# Patient Record
Sex: Female | Born: 1980 | Race: White | Hispanic: No | Marital: Married | Smoking: Never smoker
Health system: Southern US, Community
[De-identification: ages and names within clinical notes are randomized; demographics above are authoritative.]

## PROBLEM LIST (undated history)

## (undated) DIAGNOSIS — N200 Calculus of kidney: Secondary | ICD-10-CM

## (undated) DIAGNOSIS — Z87442 Personal history of urinary calculi: Secondary | ICD-10-CM

## (undated) DIAGNOSIS — I10 Essential (primary) hypertension: Secondary | ICD-10-CM

## (undated) DIAGNOSIS — N12 Tubulo-interstitial nephritis, not specified as acute or chronic: Secondary | ICD-10-CM

## (undated) DIAGNOSIS — G43909 Migraine, unspecified, not intractable, without status migrainosus: Secondary | ICD-10-CM

## (undated) DIAGNOSIS — J189 Pneumonia, unspecified organism: Secondary | ICD-10-CM

---

## 2002-11-28 HISTORY — PX: TONSILLECTOMY: SUR1361

## 2005-04-27 ENCOUNTER — Emergency Department: Payer: Self-pay | Admitting: Emergency Medicine

## 2006-06-19 ENCOUNTER — Emergency Department: Payer: Self-pay | Admitting: Emergency Medicine

## 2013-11-24 ENCOUNTER — Emergency Department: Payer: Self-pay | Admitting: Emergency Medicine

## 2013-11-24 LAB — URINALYSIS, COMPLETE
Bilirubin,UR: NEGATIVE
Glucose,UR: NEGATIVE mg/dL (ref 0–75)
Leukocyte Esterase: NEGATIVE
Ph: 8 (ref 4.5–8.0)
Protein: NEGATIVE
RBC,UR: 1 /HPF (ref 0–5)
WBC UR: 2 /HPF (ref 0–5)

## 2013-11-24 LAB — TROPONIN I: Troponin-I: <0.02 ng/mL

## 2013-11-24 LAB — CBC WITH DIFFERENTIAL/PLATELET
Basophil #: 0.1 10*3/uL (ref 0.0–0.1)
Basophil %: 1 %
Eosinophil #: 0.2 10*3/uL (ref 0.0–0.7)
HCT: 41.5 % (ref 35.0–47.0)
Lymphocyte #: 4.3 10*3/uL — ABNORMAL HIGH (ref 1.0–3.6)
Lymphocyte %: 28.6 %
MCH: 28.7 pg (ref 26.0–34.0)
MCHC: 32.9 g/dL (ref 32.0–36.0)
Monocyte #: 1.3 x10 3/mm — ABNORMAL HIGH (ref 0.2–0.9)
Neutrophil %: 60.3 %
Platelet: 291 10*3/uL (ref 150–440)
RBC: 4.76 10*6/uL (ref 3.80–5.20)
RDW: 14.2 % (ref 11.5–14.5)

## 2013-11-24 LAB — LIPASE, BLOOD: Lipase: 248 U/L (ref 73–393)

## 2013-11-24 LAB — COMPREHENSIVE METABOLIC PANEL
Alkaline Phosphatase: 85 U/L
Anion Gap: 5 — ABNORMAL LOW (ref 7–16)
Bilirubin,Total: 0.2 mg/dL (ref 0.2–1.0)
Calcium, Total: 9 mg/dL (ref 8.5–10.1)
Chloride: 109 mmol/L — ABNORMAL HIGH (ref 98–107)
Co2: 25 mmol/L (ref 21–32)
EGFR (Non-African Amer.): >60
Glucose: 138 mg/dL — ABNORMAL HIGH (ref 65–99)
Osmolality: 280 (ref 275–301)
Potassium: 4.2 mmol/L (ref 3.5–5.1)
SGOT(AST): 23 U/L (ref 15–37)
SGPT (ALT): 25 U/L (ref 12–78)
Sodium: 139 mmol/L (ref 136–145)

## 2013-11-24 LAB — PREGNANCY, URINE: Pregnancy Test, Urine: NEGATIVE m[IU]/mL

## 2014-03-31 DIAGNOSIS — I10 Essential (primary) hypertension: Secondary | ICD-10-CM | POA: Insufficient documentation

## 2014-03-31 DIAGNOSIS — G43909 Migraine, unspecified, not intractable, without status migrainosus: Secondary | ICD-10-CM | POA: Insufficient documentation

## 2015-05-19 DIAGNOSIS — G4700 Insomnia, unspecified: Secondary | ICD-10-CM | POA: Insufficient documentation

## 2015-05-19 DIAGNOSIS — Z0000 Encounter for general adult medical examination without abnormal findings: Secondary | ICD-10-CM | POA: Insufficient documentation

## 2015-05-19 DIAGNOSIS — K219 Gastro-esophageal reflux disease without esophagitis: Secondary | ICD-10-CM | POA: Insufficient documentation

## 2016-11-28 HISTORY — PX: CHOLECYSTECTOMY: SHX55

## 2017-05-26 ENCOUNTER — Encounter: Payer: Self-pay | Admitting: Emergency Medicine

## 2017-05-26 ENCOUNTER — Emergency Department: Payer: Self-pay

## 2017-05-26 ENCOUNTER — Emergency Department
Admission: EM | Admit: 2017-05-26 | Discharge: 2017-05-26 | Disposition: A | Discharge status: Home / Self Care | Payer: Self-pay | Attending: Emergency Medicine | Admitting: Emergency Medicine

## 2017-05-26 DIAGNOSIS — I10 Essential (primary) hypertension: Secondary | ICD-10-CM | POA: Insufficient documentation

## 2017-05-26 DIAGNOSIS — N200 Calculus of kidney: Secondary | ICD-10-CM | POA: Insufficient documentation

## 2017-05-26 HISTORY — DX: Essential (primary) hypertension: I10

## 2017-05-26 HISTORY — DX: Calculus of kidney: N20.0

## 2017-05-26 HISTORY — DX: Migraine, unspecified, not intractable, without status migrainosus: G43.909

## 2017-05-26 LAB — CBC
HCT: 40.8 % (ref 35.0–47.0)
HEMOGLOBIN: 13.7 g/dL (ref 12.0–16.0)
MCH: 29.2 pg (ref 26.0–34.0)
MCHC: 33.5 g/dL (ref 32.0–36.0)
MCV: 87.2 fL (ref 80.0–100.0)
Platelets: 322 10*3/uL (ref 150–440)
RBC: 4.68 MIL/uL (ref 3.80–5.20)
RDW: 15.8 % — ABNORMAL HIGH (ref 11.5–14.5)
WBC: 21 10*3/uL — ABNORMAL HIGH (ref 3.6–11.0)

## 2017-05-26 LAB — URINALYSIS, COMPLETE (UACMP) WITH MICROSCOPIC
Bilirubin Urine: NEGATIVE
Glucose, UA: NEGATIVE mg/dL
KETONES UR: NEGATIVE mg/dL
Nitrite: POSITIVE — AB
PH: 6 (ref 5.0–8.0)
Protein, ur: 100 mg/dL — AB
Specific Gravity, Urine: 1.024 (ref 1.005–1.030)

## 2017-05-26 LAB — POC URINE PREG, ED: PREG TEST UR: NEGATIVE

## 2017-05-26 LAB — BASIC METABOLIC PANEL
ANION GAP: 11 (ref 5–15)
BUN: 12 mg/dL (ref 6–20)
CALCIUM: 8.8 mg/dL — AB (ref 8.9–10.3)
CO2: 25 mmol/L (ref 22–32)
Chloride: 98 mmol/L — ABNORMAL LOW (ref 101–111)
Creatinine, Ser: 1 mg/dL (ref 0.44–1.00)
GFR calc non Af Amer: >60 mL/min (ref >60)
Glucose, Bld: 181 mg/dL — ABNORMAL HIGH (ref 65–99)
POTASSIUM: 2.8 mmol/L — AB (ref 3.5–5.1)
Sodium: 134 mmol/L — ABNORMAL LOW (ref 135–145)

## 2017-05-26 MED ORDER — MORPHINE SULFATE (PF) 4 MG/ML IV SOLN
4.0000 mg | Freq: Once | INTRAVENOUS | Status: AC
  Administered 2017-05-26: 4 mg via INTRAVENOUS

## 2017-05-26 MED ORDER — ONDANSETRON HCL 4 MG/2ML IJ SOLN
INTRAMUSCULAR | Status: AC
  Administered 2017-05-26: 4 mg via INTRAVENOUS
  Filled 2017-05-26: qty 2

## 2017-05-26 MED ORDER — ONDANSETRON HCL 4 MG/2ML IJ SOLN
4.0000 mg | Freq: Once | INTRAMUSCULAR | Status: AC
  Administered 2017-05-26: 4 mg via INTRAVENOUS

## 2017-05-26 MED ORDER — MORPHINE SULFATE (PF) 4 MG/ML IV SOLN
INTRAVENOUS | Status: AC
  Administered 2017-05-26: 4 mg via INTRAVENOUS
  Filled 2017-05-26: qty 1

## 2017-05-26 MED ORDER — KETOROLAC TROMETHAMINE 30 MG/ML IJ SOLN
30.0000 mg | Freq: Once | INTRAMUSCULAR | Status: AC
  Administered 2017-05-26: 30 mg via INTRAVENOUS
  Filled 2017-05-26: qty 1

## 2017-05-26 MED ORDER — MORPHINE SULFATE (PF) 4 MG/ML IV SOLN
INTRAVENOUS | Status: AC
Start: 2017-05-26 — End: 2017-05-26
  Administered 2017-05-26: 4 mg via INTRAVENOUS
  Filled 2017-05-26: qty 1

## 2017-05-26 MED ORDER — ONDANSETRON 4 MG PO TBDP
4.0000 mg | ORAL_TABLET | Freq: Once | ORAL | Status: AC | PRN
  Administered 2017-05-26: 4 mg via ORAL
  Filled 2017-05-26: qty 1

## 2017-05-26 MED ORDER — ONDANSETRON 4 MG PO TBDP: 4.0000 mg | ORAL_TABLET | Freq: Three times a day (TID) | ORAL | 0 refills | Status: DC | PRN

## 2017-05-26 MED ORDER — OXYCODONE-ACETAMINOPHEN 10-325 MG PO TABS: 1.0000 | ORAL_TABLET | Freq: Four times a day (QID) | ORAL | 0 refills | Status: AC | PRN

## 2017-05-26 MED ORDER — SODIUM CHLORIDE 0.9 % IV BOLUS (SEPSIS)
1000.0000 mL | Freq: Once | INTRAVENOUS | Status: AC
  Administered 2017-05-26: 1000 mL via INTRAVENOUS

## 2017-05-26 NOTE — ED Provider Notes (Signed)
[facility] Emergency Department Provider Note    First MD Initiated Contact with Patient 05/26/17 [phone]     (approximate)  I have reviewed the triage vital signs and the nursing notes.   HISTORY  Chief Complaint Flank Pain    HPI Melanie Blankenship is a 36 y.o. female with below list of chronic medical conditions including multiple kidney stones presents to the emergency department with acute onset of 10 out of 10 right flank pain that has been persistent since yesterday. Patient denies any urinary symptoms. Patient does admit to nausea. Patient denies any fever. Patient denies any dysuria or hematuria.   Past Medical History:  Diagnosis Date  . Hypertension   . Kidney stones   . Migraine     There are no active problems to display for this patient.   History reviewed. No pertinent surgical history.  Prior to Admission medications   Not on File    Allergies Latex  History reviewed. No pertinent family history.  Social History Social History  Substance Use Topics  . Smoking status: Never Smoker  . Smokeless tobacco: Never Used  . Alcohol use No    Review of Systems Constitutional: No fever/chills Eyes: No visual changes. ENT: No sore throat. Cardiovascular: Denies chest pain. Respiratory: Denies shortness of breath. Gastrointestinal: No abdominal pain.  No nausea, no vomiting.  No diarrhea.  No constipation. Genitourinary: Negative for dysuria. Musculoskeletal: Negative for neck pain.  Negative for back pain. Integumentary: Negative for rash. Neurological: Negative for headaches, focal weakness or numbness.   ____________________________________________   PHYSICAL EXAM:  VITAL SIGNS: ED Triage Vitals  Enc Vitals Group     BP 05/26/17 0236 (!) 131/97     Pulse Rate 05/26/17 0236 (!) 129     Resp 05/26/17 0236 (!) 24     Temp 05/26/17 0236 99.8 F (37.7 C)     Temp Source 05/26/17 0236 Oral     SpO2 05/26/17 0236 99 %     Weight 05/26/17 0233 96.6 kg (213 lb)     Height 05/26/17 0233 1.626 m (5\' 4" )     Head Circumference --      Peak Flow --      Pain Score 05/26/17 0233 10     Pain Loc --      Pain Edu? --      Excl. in GC? --     Constitutional: Alert and oriented. Apparent discomfort Eyes: Conjunctivae are normal. Head: Atraumatic. Mouth/Throat: Mucous membranes are moist. Oropharynx non-erythematous. Neck: No stridor.   Cardiovascular: Normal rate, regular rhythm. Good peripheral circulation. Grossly normal heart sounds. Respiratory: Normal respiratory effort.  No retractions. Lungs CTAB. Gastrointestinal: Soft and nontender. No distention.  Musculoskeletal: No lower extremity tenderness nor edema. No gross deformities of extremities. Neurologic:  Normal speech and language. No gross focal neurologic deficits are appreciated.  Skin:  Skin is warm, dry and intact. No rash noted. Psychiatric: Mood and affect are normal. Speech and behavior are normal.  ____________________________________________   LABS (all labs ordered are listed, but only abnormal results are displayed)  Labs Reviewed  URINALYSIS, COMPLETE (UACMP) WITH MICROSCOPIC - Abnormal; Notable for the following:       Result Value   Color, Urine AMBER (*)    APPearance CLOUDY (*)    Hgb urine dipstick LARGE (*)    Protein, ur 100 (*)    Nitrite POSITIVE (*)    Leukocytes, UA MODERATE (*)    All other  components within normal limits  BASIC METABOLIC PANEL - Abnormal; Notable for the following:    Sodium 134 (*)    Potassium 2.8 (*)    Chloride 98 (*)    Glucose, Bld 181 (*)    Calcium 8.8 (*)    All other components within normal limits  CBC - Abnormal; Notable for the following:    WBC 21.0 (*)    RDW 15.8 (*)    All other components within normal limits  POC URINE PREG, ED   ____________________________ RADIOLOGY I, [place] Dewayne ShorterN Isyss Espinal, personally viewed and evaluated these images (plain radiographs) as part of my  medical decision making, as well as reviewing the written report by the radiologist.  Ct Renal Stone Study  Result Date: 05/26/2017 CLINICAL DATA:  Right-sided flank pain, history of kidney stones EXAM: CT ABDOMEN AND PELVIS WITHOUT CONTRAST TECHNIQUE: Multidetector CT imaging of the abdomen and pelvis was performed following the standard protocol without IV contrast. COMPARISON:  11/24/2013 FINDINGS: Lower chest: Lung bases demonstrate no acute consolidation or pleural effusion. The heart size is normal Hepatobiliary: Nonvisualized gallbladder which may be surgically absent or markedly contracted. No biliary dilatation. No focal hepatic abnormality Pancreas: Unremarkable. No pancreatic ductal dilatation or surrounding inflammatory changes. Spleen: Normal in size without focal abnormality. Adrenals/Urinary Tract: Adrenal glands are within normal limits. Punctate stones in the upper pole of the right kidney. Right kidney is slightly atrophic. Punctate stone or cortical calcification in the lower pole left kidney. Mild right perinephric fat stranding. Slight prominence of the right renal pelvis an ureter. Sand like punctate density within the mid and distal right ureter suspicious for punctate stones. Bladder otherwise unremarkable Stomach/Bowel: Stomach is within normal limits. Appendix appears normal. No evidence of bowel wall thickening, distention, or inflammatory changes. Sigmoid colon diverticular disease without acute inflammation Vascular/Lymphatic: No significant vascular findings are present. No enlarged abdominal or pelvic lymph nodes. Reproductive: Uterus and bilateral adnexa are unremarkable. Other: No free air or free fluid.  Small fat in the umbilicus Musculoskeletal: Degenerative changes. No acute or suspicious bone lesion IMPRESSION: 1. Slightly atrophic right kidney containing multiple punctate stones in the upper pole. Slight prominence of the right renal pelvis and ureter, secondary to sand  like stones in the mid and distal ureter. 2. Small stone in the lower pole of left kidney versus tiny cortical calcification. 3. Mild sigmoid colon diverticular disease without acute inflammation. Electronically Signed   By: Jasmine PangKim  Fujinaga M.D.   On: 05/26/2017 03:54    ____________________________________________   Procedures   ____________________________________________   INITIAL IMPRESSION / ASSESSMENT AND PLAN / ED COURSE  Pertinent labs & imaging results that were available during my care of the patient were reviewed by me and considered in my medical decision making (see chart for details).  History physical exam consistent with possible kidney stone Patient received multiple doses of IV morphine with pain improvement. After confirmation of kidney stone patient given Toradol. Patient be referred to her urologist Dr. Evelene CroonWolff.      ____________________________________________  FINAL CLINICAL IMPRESSION(S) / ED DIAGNOSES  Final diagnoses:  Kidney stone     MEDICATIONS GIVEN DURING THIS VISIT:  Medications  ketorolac (TORADOL) 30 MG/ML injection 30 mg (not administered)  ondansetron (ZOFRAN-ODT) disintegrating tablet 4 mg (4 mg Oral Given 05/26/17 0247)  ondansetron (ZOFRAN) injection 4 mg (4 mg Intravenous Given 05/26/17 0318)  morphine 4 MG/ML injection 4 mg (4 mg Intravenous Given 05/26/17 0318)  sodium chloride 0.9 % bolus 1,000 mL (  0 mLs Intravenous Stopped 05/26/17 0436)  morphine 4 MG/ML injection 4 mg (4 mg Intravenous Given 05/26/17 0356)     NEW OUTPATIENT MEDICATIONS STARTED DURING THIS VISIT:  New Prescriptions   No medications on file    Modified Medications   No medications on file    Discontinued Medications   No medications on file     Note:  This document was prepared using Dragon voice recognition software and may include unintentional dictation errors.    Darci Current, MD 05/27/17 [phone]

## 2017-05-26 NOTE — ED Triage Notes (Signed)
Pt to triage via WC report right flank pain since yesterday afternoon, hx of kidney stones states feels similar.  Pt reports nausea and headache as well.

## 2017-05-26 NOTE — ED Notes (Signed)
Patient transported to MRI 

## 2018-01-22 DIAGNOSIS — N200 Calculus of kidney: Secondary | ICD-10-CM | POA: Insufficient documentation

## 2018-02-05 DIAGNOSIS — R251 Tremor, unspecified: Secondary | ICD-10-CM | POA: Insufficient documentation

## 2018-02-15 DIAGNOSIS — R87810 Cervical high risk human papillomavirus (HPV) DNA test positive: Secondary | ICD-10-CM | POA: Insufficient documentation

## 2018-07-13 ENCOUNTER — Emergency Department
Admission: EM | Admit: 2018-07-13 | Discharge: 2018-07-13 | Disposition: A | Discharge status: Home / Self Care | Payer: 59 | Attending: Emergency Medicine | Admitting: Emergency Medicine

## 2018-07-13 ENCOUNTER — Emergency Department: Payer: 59

## 2018-07-13 ENCOUNTER — Other Ambulatory Visit: Payer: Self-pay

## 2018-07-13 ENCOUNTER — Encounter: Payer: Self-pay | Admitting: Emergency Medicine

## 2018-07-13 DIAGNOSIS — Z79899 Other long term (current) drug therapy: Secondary | ICD-10-CM | POA: Diagnosis not present

## 2018-07-13 DIAGNOSIS — N12 Tubulo-interstitial nephritis, not specified as acute or chronic: Secondary | ICD-10-CM | POA: Diagnosis not present

## 2018-07-13 DIAGNOSIS — I10 Essential (primary) hypertension: Secondary | ICD-10-CM | POA: Diagnosis not present

## 2018-07-13 DIAGNOSIS — R109 Unspecified abdominal pain: Secondary | ICD-10-CM | POA: Diagnosis present

## 2018-07-13 LAB — CBC WITH DIFFERENTIAL/PLATELET
BASOS ABS: 0.1 10*3/uL (ref 0–0.1)
Basophils Relative: 1 %
Eosinophils Absolute: 0.1 10*3/uL (ref 0–0.7)
Eosinophils Relative: 1 %
HEMATOCRIT: 36.8 % (ref 35.0–47.0)
HEMOGLOBIN: 12.1 g/dL (ref 12.0–16.0)
LYMPHS ABS: 1.6 10*3/uL (ref 1.0–3.6)
Lymphocytes Relative: 11 %
MCH: 27.8 pg (ref 26.0–34.0)
MCHC: 32.8 g/dL (ref 32.0–36.0)
MCV: 84.9 fL (ref 80.0–100.0)
MONOS PCT: 9 %
Monocytes Absolute: 1.3 10*3/uL — ABNORMAL HIGH (ref 0.2–0.9)
NEUTROS PCT: 78 %
Neutro Abs: 11.7 10*3/uL — ABNORMAL HIGH (ref 1.4–6.5)
Platelets: 373 10*3/uL (ref 150–440)
RBC: 4.34 MIL/uL (ref 3.80–5.20)
RDW: 16.3 % — ABNORMAL HIGH (ref 11.5–14.5)
WBC: 14.9 10*3/uL — AB (ref 3.6–11.0)

## 2018-07-13 LAB — URINALYSIS, COMPLETE (UACMP) WITH MICROSCOPIC
Bacteria, UA: NONE SEEN
Bilirubin Urine: NEGATIVE
GLUCOSE, UA: NEGATIVE mg/dL
Ketones, ur: NEGATIVE mg/dL
Nitrite: NEGATIVE
PH: 6 (ref 5.0–8.0)
Protein, ur: 30 mg/dL — AB
RBC / HPF: >50 RBC/hpf — ABNORMAL HIGH (ref 0–5)
SPECIFIC GRAVITY, URINE: 1.014 (ref 1.005–1.030)
WBC, UA: >50 WBC/hpf — ABNORMAL HIGH (ref 0–5)

## 2018-07-13 LAB — COMPREHENSIVE METABOLIC PANEL
ALK PHOS: 87 U/L (ref 38–126)
ALT: 14 U/L (ref 0–44)
AST: 15 U/L (ref 15–41)
Albumin: 3.7 g/dL (ref 3.5–5.0)
Anion gap: 11 (ref 5–15)
BILIRUBIN TOTAL: 0.5 mg/dL (ref 0.3–1.2)
BUN: 18 mg/dL (ref 6–20)
CALCIUM: 9.1 mg/dL (ref 8.9–10.3)
CO2: 28 mmol/L (ref 22–32)
CREATININE: 1 mg/dL (ref 0.44–1.00)
Chloride: 97 mmol/L — ABNORMAL LOW (ref 98–111)
GFR calc Af Amer: >60 mL/min (ref >60)
Glucose, Bld: 101 mg/dL — ABNORMAL HIGH (ref 70–99)
POTASSIUM: 2.7 mmol/L — AB (ref 3.5–5.1)
Sodium: 136 mmol/L (ref 135–145)
TOTAL PROTEIN: 8.2 g/dL — AB (ref 6.5–8.1)

## 2018-07-13 MED ORDER — SODIUM CHLORIDE 0.9 % IV SOLN
1000.0000 mL | Freq: Once | INTRAVENOUS | Status: AC
  Administered 2018-07-13: 1000 mL via INTRAVENOUS

## 2018-07-13 MED ORDER — KETOROLAC TROMETHAMINE 30 MG/ML IJ SOLN
30.0000 mg | Freq: Once | INTRAMUSCULAR | Status: AC
  Administered 2018-07-13: 30 mg via INTRAVENOUS
  Filled 2018-07-13: qty 1

## 2018-07-13 MED ORDER — SODIUM CHLORIDE 0.9 % IV SOLN
1.0000 g | Freq: Once | INTRAVENOUS | Status: AC
  Administered 2018-07-13: 1 g via INTRAVENOUS
  Filled 2018-07-13: qty 10

## 2018-07-13 MED ORDER — ONDANSETRON 4 MG PO TBDP
4.0000 mg | ORAL_TABLET | Freq: Once | ORAL | Status: AC
  Administered 2018-07-13: 4 mg via ORAL

## 2018-07-13 MED ORDER — ONDANSETRON 4 MG PO TBDP
ORAL_TABLET | ORAL | Status: AC
  Filled 2018-07-13: qty 1

## 2018-07-13 NOTE — ED Triage Notes (Signed)
Sent by dr due to kidney stones.  Right flank pain.  Yesterday had wbc 24 and was given rocephin and cipro.  Today wbc is 17, but patient not better and they were worried about obstructing stone.

## 2018-07-13 NOTE — ED Notes (Signed)
ED Provider at bedside. 

## 2018-07-13 NOTE — Discharge Instructions (Addendum)
Please continue the Ciprofloxacin antibiotic you are taking

## 2018-07-13 NOTE — ED Provider Notes (Signed)
[facility] Emergency Department Provider Note   ____________________________________________    I have reviewed the triage vital signs and the nursing notes.   HISTORY  Chief Complaint Flank pain    HPI Melanie Blankenship is a 37 y.o. female who presents with complaints of right flank pain.  Patient reports she has had flank pain over the last several days, seen by urgent care recently given shot of Rocephin for likely UTI.  However symptoms have continued.  She does have a history of kidney stones so today she was sent over for evaluation.  Did have some nausea when being stuck for blood but otherwise denies nausea or vomiting.  Flank pain is sharp in nature and intermittent.  No fevers reported.   Past Medical History:  Diagnosis Date  . Hypertension   . Kidney stones   . Migraine     There are no active problems to display for this patient.   History reviewed. No pertinent surgical history.  Prior to Admission medications   Medication Sig Start Date End Date Taking? Authorizing Provider  ciprofloxacin (CIPRO) 500 MG tablet Take 500 mg by mouth 2 (two) times daily. 07/12/18  Yes [provider]  cyanocobalamin 1000 MCG tablet Take 1,000 mcg by mouth daily.   Yes [provider]  hydrochlorothiazide (HYDRODIURIL) 25 MG tablet Take 25 mg by mouth daily.   Yes [provider]  HYDROcodone-acetaminophen (NORCO/VICODIN) 5-325 MG tablet Take 1 tablet by mouth every 6 (six) hours as needed for pain. 07/12/18  Yes [provider]  neomycin-polymyxin-hydrocortisone (CORTISPORIN) 3.5-10000-1 OTIC suspension Place 3 drops into the right ear 4 (four) times daily. 07/12/18  Yes [provider]  ondansetron (ZOFRAN ODT) 4 MG disintegrating tablet Take 1 tablet (4 mg total) by mouth every 8 (eight) hours as needed for nausea or vomiting. 05/26/17  Yes Darci CurrentBrown, [place] N, MD  promethazine (PHENERGAN) 25 MG tablet Take 25 mg  by mouth every 6 (six) hours as needed for nausea/vomiting. 07/12/18  Yes [provider]  traZODone (DESYREL) 100 MG tablet Take 50 mg by mouth at bedtime as needed. 07/03/14  Yes [provider]     Allergies Latex  No family history on file.  Social History Social History   Tobacco Use  . Smoking status: Never Smoker  . Smokeless tobacco: Never Used  Substance Use Topics  . Alcohol use: No  . Drug use: Not on file    Review of Systems  Constitutional: No fever/chills Eyes: No visual changes.  ENT: No sore throat. Cardiovascular: Denies chest pain. Respiratory: Denies shortness of breath. Gastrointestinal: As above Genitourinary: Negative for dysuria. Musculoskeletal: Negative for back pain. Skin: Negative for rash. Neurological: Negative for headaches or weakness   ____________________________________________   PHYSICAL EXAM:  VITAL SIGNS: ED Triage Vitals  Enc Vitals Group     BP 07/13/18 0911 130/85     Pulse Rate 07/13/18 0911 90     Resp 07/13/18 0911 18     Temp 07/13/18 0911 98.8 F (37.1 C)     Temp Source 07/13/18 0911 Oral     SpO2 07/13/18 0911 100 %     Weight 07/13/18 0914 90.7 kg (200 lb)     Height 07/13/18 0914 1.626 m (5\' 4" )     Head Circumference --      Peak Flow --      Pain Score 07/13/18 0914 6     Pain Loc --  Pain Edu? --      Excl. in GC? --     Constitutional: Alert and oriented. No acute distress. Pleasant and interactive  Nose: No congestion/rhinnorhea.  Cardiovascular: Normal rate, regular rhythm  Good peripheral circulation. Respiratory: Normal respiratory effort.  No retractions. . Gastrointestinal: Soft and nontender. No distention.  Minimal right CVA tenderness  Musculoskeletal: No lower extremity tenderness nor edema.  Warm and well perfused Neurologic:  Normal speech and language. No gross focal neurologic deficits are appreciated.  Skin:  Skin is warm, dry and intact. No rash  noted. Psychiatric: Mood and affect are normal. Speech and behavior are normal.  ____________________________________________   LABS (all labs ordered are listed, but only abnormal results are displayed)  Labs Reviewed  CBC WITH DIFFERENTIAL/PLATELET - Abnormal; Notable for the following components:      Result Value   WBC 14.9 (*)    RDW 16.3 (*)    Neutro Abs 11.7 (*)    Monocytes Absolute 1.3 (*)    All other components within normal limits  COMPREHENSIVE METABOLIC PANEL - Abnormal; Notable for the following components:   Potassium 2.7 (*)    Chloride 97 (*)    Glucose, Bld 101 (*)    Total Protein 8.2 (*)    All other components within normal limits  URINALYSIS, COMPLETE (UACMP) WITH MICROSCOPIC - Abnormal; Notable for the following components:   Color, Urine YELLOW (*)    APPearance HAZY (*)    Hgb urine dipstick LARGE (*)    Protein, ur 30 (*)    Leukocytes, UA MODERATE (*)    RBC / HPF >50 (*)    WBC, UA >50 (*)    All other components within normal limits  URINE CULTURE   ____________________________________________  EKG  None____________________________________________  RADIOLOGY  CT renal stone study shows no kidney stone but signs of pyelonephritis ____________________________________________   PROCEDURES  Procedure(s) performed: No  Procedures   Critical Care performed: No ____________________________________________   INITIAL IMPRESSION / ASSESSMENT AND PLAN / ED COURSE  Pertinent labs & imaging results that were available during my care of the patient were reviewed by me and considered in my medical decision making (see chart for details).  Patient presents with right flank pain, mildly elevated white blood cell count, afebrile, normal heart rate.  CT scan obtained to evaluate for kidney stone, no evidence of kidney stone, but signs of pyelonephritis.  Overall patient is well-appearing we will give a dose of IV Rocephin here but anticipate  discharge since she is tolerating p.o. medications and does not want to stay in the hospital    ____________________________________________   FINAL CLINICAL IMPRESSION(S) / ED DIAGNOSES  Final diagnoses:  Pyelonephritis        Note:  This document was prepared using Dragon voice recognition software and may include unintentional dictation errors.    Jene EveryKinner, Callie Bunyard, MD 07/13/18 1332

## 2018-07-13 NOTE — ED Triage Notes (Signed)
C/O right flank pain for "quite a while".  Patient given rocephin and cipro yesterday and flank pain continues.  Sent to ED for CT to evaluate for renal colic.  Patient is AAOx3.  Skin warm and dry. NAD

## 2018-07-13 NOTE — ED Notes (Signed)
Assumed care of pt. She just arrived back from CT scan. Denies complaints at this time. Resting comfortably.

## 2018-07-13 NOTE — ED Notes (Signed)
Verbal report given to Lex[place] Hillshristie, Charity fundraiserN.

## 2018-07-14 LAB — URINE CULTURE: CULTURE: NO GROWTH

## 2019-02-07 DIAGNOSIS — R109 Unspecified abdominal pain: Secondary | ICD-10-CM | POA: Insufficient documentation

## 2019-04-10 DIAGNOSIS — N879 Dysplasia of cervix uteri, unspecified: Secondary | ICD-10-CM | POA: Insufficient documentation

## 2021-07-16 ENCOUNTER — Encounter: Payer: Self-pay | Admitting: Emergency Medicine

## 2021-07-16 ENCOUNTER — Emergency Department: Payer: BC Managed Care – PPO

## 2021-07-16 ENCOUNTER — Emergency Department
Admission: EM | Admit: 2021-07-16 | Discharge: 2021-07-16 | Disposition: A | Discharge status: Home / Self Care | Payer: BC Managed Care – PPO | Attending: Emergency Medicine | Admitting: Emergency Medicine

## 2021-07-16 ENCOUNTER — Other Ambulatory Visit: Payer: Self-pay

## 2021-07-16 DIAGNOSIS — Z91040 Latex allergy status: Secondary | ICD-10-CM | POA: Diagnosis not present

## 2021-07-16 DIAGNOSIS — R109 Unspecified abdominal pain: Secondary | ICD-10-CM | POA: Diagnosis present

## 2021-07-16 DIAGNOSIS — N390 Urinary tract infection, site not specified: Secondary | ICD-10-CM | POA: Insufficient documentation

## 2021-07-16 DIAGNOSIS — I10 Essential (primary) hypertension: Secondary | ICD-10-CM | POA: Insufficient documentation

## 2021-07-16 DIAGNOSIS — Z79899 Other long term (current) drug therapy: Secondary | ICD-10-CM | POA: Diagnosis not present

## 2021-07-16 DIAGNOSIS — B9689 Other specified bacterial agents as the cause of diseases classified elsewhere: Secondary | ICD-10-CM | POA: Insufficient documentation

## 2021-07-16 LAB — URINALYSIS, COMPLETE (UACMP) WITH MICROSCOPIC
Bilirubin Urine: NEGATIVE
Glucose, UA: NEGATIVE mg/dL
Ketones, ur: NEGATIVE mg/dL
Nitrite: POSITIVE — AB
Protein, ur: NEGATIVE mg/dL
Specific Gravity, Urine: 1.016 (ref 1.005–1.030)
pH: 6 (ref 5.0–8.0)

## 2021-07-16 LAB — CBC
HCT: 43.1 % (ref 36.0–46.0)
Hemoglobin: 14.4 g/dL (ref 12.0–15.0)
MCH: 29.8 pg (ref 26.0–34.0)
MCHC: 33.4 g/dL (ref 30.0–36.0)
MCV: 89 fL (ref 80.0–100.0)
Platelets: 458 10*3/uL — ABNORMAL HIGH (ref 150–400)
RBC: 4.84 MIL/uL (ref 3.87–5.11)
RDW: 14.3 % (ref 11.5–15.5)
WBC: 13.8 10*3/uL — ABNORMAL HIGH (ref 4.0–10.5)
nRBC: 0 % (ref 0.0–0.2)

## 2021-07-16 LAB — POC URINE PREG, ED: Preg Test, Ur: NEGATIVE

## 2021-07-16 LAB — BASIC METABOLIC PANEL
Anion gap: 8 (ref 5–15)
BUN: 12 mg/dL (ref 6–20)
CO2: 31 mmol/L (ref 22–32)
Calcium: 8.9 mg/dL (ref 8.9–10.3)
Chloride: 96 mmol/L — ABNORMAL LOW (ref 98–111)
Creatinine, Ser: 0.84 mg/dL (ref 0.44–1.00)
GFR, Estimated: >60 mL/min (ref >60)
Glucose, Bld: 90 mg/dL (ref 70–99)
Potassium: 2.7 mmol/L — CL (ref 3.5–5.1)
Sodium: 135 mmol/L (ref 135–145)

## 2021-07-16 MED ORDER — KETOROLAC TROMETHAMINE 30 MG/ML IJ SOLN: 30.0000 mg | Freq: Once | INTRAMUSCULAR | Status: DC

## 2021-07-16 MED ORDER — TRAMADOL HCL 50 MG PO TABS: 50.0000 mg | ORAL_TABLET | Freq: Four times a day (QID) | ORAL | 0 refills | Status: DC | PRN

## 2021-07-16 MED ORDER — SODIUM CHLORIDE 0.9 % IV SOLN
1.0000 g | Freq: Once | INTRAVENOUS | Status: AC
  Administered 2021-07-16: 1 g via INTRAVENOUS
  Filled 2021-07-16: qty 10

## 2021-07-16 MED ORDER — SODIUM CHLORIDE 0.9 % IV BOLUS
500.0000 mL | Freq: Once | INTRAVENOUS | Status: AC
  Administered 2021-07-16: 500 mL via INTRAVENOUS

## 2021-07-16 MED ORDER — CIPROFLOXACIN HCL 500 MG PO TABS: 500.0000 mg | ORAL_TABLET | Freq: Two times a day (BID) | ORAL | 0 refills | Status: DC

## 2021-07-16 MED ORDER — KETOROLAC TROMETHAMINE 30 MG/ML IJ SOLN
30.0000 mg | Freq: Once | INTRAMUSCULAR | Status: AC
  Administered 2021-07-16: 30 mg via INTRAVENOUS
  Filled 2021-07-16: qty 1

## 2021-07-16 NOTE — ED Provider Notes (Signed)
[facility] Emergency Department Provider Note   ____________________________________________    I have reviewed the triage vital signs and the nursing notes.   HISTORY  Chief Complaint Flank Pain     HPI Melanie Blankenship is a 40 y.o. female with history of high blood pressure kidney stones, migraines who presents with complaints of right flank pain for approximately 2 months.  Patient has been having that it would get better but eventually it seemed to get worse this past week so she presented to the emergency department.  She reports history of kidney stones and notes this does feel similar.  She also reports she has right-sided kidney atrophy.  In the past she has had frequent urinary tract infections as  well.  No fevers chills or nausea  Past Medical History:  Diagnosis Date   Hypertension    Kidney stones    Migraine     There are no problems to display for this patient.   History reviewed. No pertinent surgical history.  Prior to Admission medications   Medication Sig Start Date End Date Taking? Authorizing Provider  ciprofloxacin (CIPRO) 500 MG tablet Take 1 tablet (500 mg total) by mouth 2 (two) times daily. 07/16/21  Yes Jene Every, MD  traMADol (ULTRAM) 50 MG tablet Take 1 tablet (50 mg total) by mouth every 6 (six) hours as needed. 07/16/21 07/16/22 Yes Jene Every, MD  cyanocobalamin 1000 MCG tablet Take 1,000 mcg by mouth daily.    [provider]  hydrochlorothiazide (HYDRODIURIL) 25 MG tablet Take 25 mg by mouth daily.    [provider]  HYDROcodone-acetaminophen (NORCO/VICODIN) 5-325 MG tablet Take 1 tablet by mouth every 6 (six) hours as needed for pain. 07/12/18   [provider]  neomycin-polymyxin-hydrocortisone (CORTISPORIN) 3.5-10000-1 OTIC suspension Place 3 drops into the right ear 4 (four) times daily. 07/12/18   [provider]  ondansetron (ZOFRAN ODT) 4 MG disintegrating tablet  Take 1 tablet (4 mg total) by mouth every 8 (eight) hours as needed for nausea or vomiting. 05/26/17   Darci Current, MD  promethazine (PHENERGAN) 25 MG tablet Take 25 mg by mouth every 6 (six) hours as needed for nausea/vomiting. 07/12/18   [provider]  traZODone (DESYREL) 100 MG tablet Take 50 mg by mouth at bedtime as needed. 07/03/14   [provider]     Allergies Latex  History reviewed. No pertinent family history.  Social History Social History   Tobacco Use   Smoking status: Never   Smokeless tobacco: Never  Substance Use Topics   Alcohol use: No    Review of Systems  Constitutional: No fever/chills Eyes: No visual changes.  ENT: No sore throat. Cardiovascular: Denies chest pain. Respiratory: Denies shortness of breath. Gastrointestinal: As above Genitourinary: Positive for dysuria Musculoskeletal: As above Skin: Negative for rash. Neurological: Negative for headaches or weakness   ____________________________________________   PHYSICAL EXAM:  VITAL SIGNS: ED Triage Vitals  Enc Vitals Group     BP 07/16/21 1332 (!) 142/64     Pulse Rate 07/16/21 1332 84     Resp 07/16/21 1332 20     Temp 07/16/21 1332 98.9 F (37.2 C)     Temp Source 07/16/21 1332 Oral     SpO2 07/16/21 1332 99 %     Weight 07/16/21 1330 90.7 kg (199 lb 15.3 oz)     Height 07/16/21 1330 1.626 m (5\' 4" )     Head Circumference --  Peak Flow --      Pain Score 07/16/21 1330 9     Pain Loc --      Pain Edu? --      Excl. in GC? --     Constitutional: Alert and oriented. No acute distress. Pleasant and interactive  Nose: No congestion/rhinnorhea. Mouth/Throat: Mucous membranes are moist.    Cardiovascular: Normal rate, regular rhythm. Grossly normal heart sounds.  Good peripheral circulation. Respiratory: Normal respiratory effort.  No retractions.  Gastrointestinal: Soft and nontender. No distention.  Mild right CVA tenderness Genitourinary:  deferred Musculoskeletal: No lower extremity tenderness nor edema.  Warm and well perfused Neurologic:  Normal speech and language. No gross focal neurologic deficits are appreciated.  Skin:  Skin is warm, dry and intact. No rash noted. Psychiatric: Mood and affect are normal. Speech and behavior are normal.  ____________________________________________   LABS (all labs ordered are listed, but only abnormal results are displayed)  Labs Reviewed  URINALYSIS, COMPLETE (UACMP) WITH MICROSCOPIC - Abnormal; Notable for the following components:      Result Value   Color, Urine AMBER (*)    APPearance CLEAR (*)    Hgb urine dipstick SMALL (*)    Nitrite POSITIVE (*)    Leukocytes,Ua SMALL (*)    Bacteria, UA MANY (*)    All other components within normal limits  CBC - Abnormal; Notable for the following components:   WBC 13.8 (*)    Platelets 458 (*)    All other components within normal limits  BASIC METABOLIC PANEL - Abnormal; Notable for the following components:   Potassium 2.7 (*)    Chloride 96 (*)    All other components within normal limits  POC URINE PREG, ED   ____________________________________________  EKG  None ____________________________________________  RADIOLOGY  CT renal stone study reviewed by me, no acute abnormalities ____________________________________________   PROCEDURES  Procedure(s) performed: No  Procedures   Critical Care performed: No ____________________________________________   INITIAL IMPRESSION / ASSESSMENT AND PLAN / ED COURSE  Pertinent labs & imaging results that were available during my care of the patient were reviewed by me and considered in my medical decision making (see chart for details).   Patient presents with right flank pain as detailed above, differential includes ureterolithiasis, UTI, pyelonephritis.  Treated with IV Toradol, IV fluids, sent for CT renal stone study  Lab work notable for urinalysis consistent  with urinary tract infection, mildly elevated white blood cell count.  Treated with IV Rocephin given concern for possible early [place].  Patient has potassium of 2.7 however this is chronic for her.  CT scan is negative for ureterolithiasis.  Patient is feeling improved, appropriate for discharge with outpatient follow-up, return cautions discussed    ____________________________________________   FINAL CLINICAL IMPRESSION(S) / ED DIAGNOSES  Final diagnoses:  Lower urinary tract infectious disease        Note:  This document was prepared using Dragon voice recognition software and may include unintentional dictation errors.    Jene Every, MD 07/16/21 [phone]

## 2021-07-16 NOTE — ED Triage Notes (Signed)
Pt comes into the ED via POV c/o right flank pain that has been ongoing x 2 months.  Pt states she has a h/o kidney stones and kidney infection.  Pt denies any nausea at this time and is in NAD with even and unlabored respirations.

## 2021-12-23 DIAGNOSIS — Z0000 Encounter for general adult medical examination without abnormal findings: Secondary | ICD-10-CM | POA: Diagnosis not present

## 2021-12-23 DIAGNOSIS — R87810 Cervical high risk human papillomavirus (HPV) DNA test positive: Secondary | ICD-10-CM | POA: Diagnosis not present

## 2021-12-23 DIAGNOSIS — G4700 Insomnia, unspecified: Secondary | ICD-10-CM | POA: Diagnosis not present

## 2021-12-23 DIAGNOSIS — M722 Plantar fascial fibromatosis: Secondary | ICD-10-CM | POA: Insufficient documentation

## 2021-12-23 DIAGNOSIS — D229 Melanocytic nevi, unspecified: Secondary | ICD-10-CM | POA: Insufficient documentation

## 2021-12-23 DIAGNOSIS — I10 Essential (primary) hypertension: Secondary | ICD-10-CM | POA: Diagnosis not present

## 2021-12-23 DIAGNOSIS — Z23 Encounter for immunization: Secondary | ICD-10-CM | POA: Diagnosis not present

## 2021-12-23 DIAGNOSIS — R109 Unspecified abdominal pain: Secondary | ICD-10-CM | POA: Diagnosis not present

## 2022-03-21 ENCOUNTER — Emergency Department
Admission: EM | Admit: 2022-03-21 | Discharge: 2022-03-21 | Disposition: A | Discharge status: Home / Self Care | Payer: BC Managed Care – PPO | Attending: Emergency Medicine | Admitting: Emergency Medicine

## 2022-03-21 ENCOUNTER — Emergency Department: Payer: BC Managed Care – PPO

## 2022-03-21 ENCOUNTER — Other Ambulatory Visit: Payer: Self-pay

## 2022-03-21 ENCOUNTER — Encounter: Payer: Self-pay | Admitting: Emergency Medicine

## 2022-03-21 DIAGNOSIS — Z20822 Contact with and (suspected) exposure to covid-19: Secondary | ICD-10-CM | POA: Insufficient documentation

## 2022-03-21 DIAGNOSIS — G43811 Other migraine, intractable, with status migrainosus: Secondary | ICD-10-CM | POA: Diagnosis not present

## 2022-03-21 DIAGNOSIS — E876 Hypokalemia: Secondary | ICD-10-CM | POA: Insufficient documentation

## 2022-03-21 DIAGNOSIS — N12 Tubulo-interstitial nephritis, not specified as acute or chronic: Secondary | ICD-10-CM | POA: Insufficient documentation

## 2022-03-21 DIAGNOSIS — G43911 Migraine, unspecified, intractable, with status migrainosus: Secondary | ICD-10-CM | POA: Diagnosis not present

## 2022-03-21 DIAGNOSIS — K760 Fatty (change of) liver, not elsewhere classified: Secondary | ICD-10-CM | POA: Diagnosis not present

## 2022-03-21 DIAGNOSIS — R1031 Right lower quadrant pain: Secondary | ICD-10-CM | POA: Diagnosis not present

## 2022-03-21 DIAGNOSIS — R509 Fever, unspecified: Secondary | ICD-10-CM | POA: Diagnosis not present

## 2022-03-21 DIAGNOSIS — R109 Unspecified abdominal pain: Secondary | ICD-10-CM | POA: Diagnosis not present

## 2022-03-21 DIAGNOSIS — R519 Headache, unspecified: Secondary | ICD-10-CM | POA: Diagnosis not present

## 2022-03-21 LAB — COMPREHENSIVE METABOLIC PANEL
ALT: 11 U/L (ref 0–44)
AST: 14 U/L — ABNORMAL LOW (ref 15–41)
Albumin: 3.6 g/dL (ref 3.5–5.0)
Alkaline Phosphatase: 67 U/L (ref 38–126)
Anion gap: 13 (ref 5–15)
BUN: 14 mg/dL (ref 6–20)
CO2: 30 mmol/L (ref 22–32)
Calcium: 9 mg/dL (ref 8.9–10.3)
Chloride: 95 mmol/L — ABNORMAL LOW (ref 98–111)
Creatinine, Ser: 0.95 mg/dL (ref 0.44–1.00)
GFR, Estimated: >60 mL/min (ref >60)
Glucose, Bld: 133 mg/dL — ABNORMAL HIGH (ref 70–99)
Potassium: 2.9 mmol/L — ABNORMAL LOW (ref 3.5–5.1)
Sodium: 138 mmol/L (ref 135–145)
Total Bilirubin: 0.4 mg/dL (ref 0.3–1.2)
Total Protein: 8.2 g/dL — ABNORMAL HIGH (ref 6.5–8.1)

## 2022-03-21 LAB — CBC
HCT: 44.1 % (ref 36.0–46.0)
Hemoglobin: 14.2 g/dL (ref 12.0–15.0)
MCH: 28.3 pg (ref 26.0–34.0)
MCHC: 32.2 g/dL (ref 30.0–36.0)
MCV: 87.8 fL (ref 80.0–100.0)
Platelets: 408 10*3/uL — ABNORMAL HIGH (ref 150–400)
RBC: 5.02 MIL/uL (ref 3.87–5.11)
RDW: 14.7 % (ref 11.5–15.5)
WBC: 18.4 10*3/uL — ABNORMAL HIGH (ref 4.0–10.5)
nRBC: 0 % (ref 0.0–0.2)

## 2022-03-21 LAB — URINALYSIS, ROUTINE W REFLEX MICROSCOPIC
Bilirubin Urine: NEGATIVE
Glucose, UA: NEGATIVE mg/dL
Ketones, ur: NEGATIVE mg/dL
Nitrite: POSITIVE — AB
Protein, ur: 100 mg/dL — AB
Specific Gravity, Urine: 1.015 (ref 1.005–1.030)
WBC, UA: >50 WBC/hpf — ABNORMAL HIGH (ref 0–5)
pH: 6 (ref 5.0–8.0)

## 2022-03-21 LAB — MAGNESIUM: Magnesium: 2.4 mg/dL (ref 1.7–2.4)

## 2022-03-21 LAB — POC URINE PREG, ED: Preg Test, Ur: NEGATIVE

## 2022-03-21 LAB — PROCALCITONIN: Procalcitonin: 0.2 ng/mL

## 2022-03-21 LAB — RESP PANEL BY RT-PCR (FLU A&B, COVID) ARPGX2
Influenza A by PCR: NEGATIVE
Influenza B by PCR: NEGATIVE
SARS Coronavirus 2 by RT PCR: NEGATIVE

## 2022-03-21 LAB — LIPASE, BLOOD: Lipase: 41 U/L (ref 11–51)

## 2022-03-21 MED ORDER — IBUPROFEN 600 MG PO TABS: 600.0000 mg | ORAL_TABLET | Freq: Four times a day (QID) | ORAL | 0 refills | Status: AC | PRN

## 2022-03-21 MED ORDER — SODIUM CHLORIDE 0.9 % IV SOLN
2.0000 g | Freq: Once | INTRAVENOUS | Status: AC
  Administered 2022-03-21: 2 g via INTRAVENOUS
  Filled 2022-03-21: qty 20

## 2022-03-21 MED ORDER — POTASSIUM CHLORIDE CRYS ER 20 MEQ PO TBCR: 20.0000 meq | EXTENDED_RELEASE_TABLET | Freq: Every day | ORAL | 0 refills | Status: DC

## 2022-03-21 MED ORDER — POTASSIUM CHLORIDE 10 MEQ/100ML IV SOLN
10.0000 meq | INTRAVENOUS | Status: AC
  Administered 2022-03-21 (×2): 10 meq via INTRAVENOUS
  Filled 2022-03-21 (×2): qty 100

## 2022-03-21 MED ORDER — SODIUM CHLORIDE 0.9 % IV BOLUS
1000.0000 mL | Freq: Once | INTRAVENOUS | Status: AC
  Administered 2022-03-21: 1000 mL via INTRAVENOUS

## 2022-03-21 MED ORDER — OXYCODONE HCL 5 MG PO TABS
5.0000 mg | ORAL_TABLET | Freq: Four times a day (QID) | ORAL | 0 refills | Status: AC | PRN
Start: 2022-03-21 — End: 2022-03-23

## 2022-03-21 MED ORDER — PROCHLORPERAZINE EDISYLATE 10 MG/2ML IJ SOLN
10.0000 mg | Freq: Once | INTRAMUSCULAR | Status: AC
  Administered 2022-03-21: 10 mg via INTRAVENOUS
  Filled 2022-03-21: qty 2

## 2022-03-21 MED ORDER — ACETAMINOPHEN 500 MG PO TABS
1000.0000 mg | ORAL_TABLET | Freq: Once | ORAL | Status: AC
  Administered 2022-03-21: 1000 mg via ORAL
  Filled 2022-03-21: qty 2

## 2022-03-21 MED ORDER — POTASSIUM CHLORIDE CRYS ER 20 MEQ PO TBCR
40.0000 meq | EXTENDED_RELEASE_TABLET | Freq: Once | ORAL | Status: AC
  Administered 2022-03-21: 40 meq via ORAL
  Filled 2022-03-21: qty 2

## 2022-03-21 MED ORDER — DIPHENHYDRAMINE HCL 50 MG/ML IJ SOLN
12.5000 mg | Freq: Once | INTRAMUSCULAR | Status: AC
  Administered 2022-03-21: 12.5 mg via INTRAVENOUS
  Filled 2022-03-21: qty 1

## 2022-03-21 MED ORDER — CEFDINIR 300 MG PO CAPS: 300.0000 mg | ORAL_CAPSULE | Freq: Two times a day (BID) | ORAL | 0 refills | Status: AC

## 2022-03-21 MED ORDER — POTASSIUM CHLORIDE CRYS ER 20 MEQ PO TBCR: 20.0000 meq | EXTENDED_RELEASE_TABLET | Freq: Two times a day (BID) | ORAL | 0 refills | Status: DC

## 2022-03-21 MED ORDER — IOHEXOL 300 MG/ML  SOLN
100.0000 mL | Freq: Once | INTRAMUSCULAR | Status: AC | PRN
  Administered 2022-03-21: 100 mL via INTRAVENOUS
  Filled 2022-03-21: qty 100

## 2022-03-21 MED ORDER — KETOROLAC TROMETHAMINE 15 MG/ML IJ SOLN
15.0000 mg | Freq: Once | INTRAMUSCULAR | Status: AC
  Administered 2022-03-21: 15 mg via INTRAVENOUS
  Filled 2022-03-21: qty 1

## 2022-03-21 NOTE — Discharge Instructions (Addendum)
Take the antibiotics to help with concern for pyelonephritis.  Your potassium was low and this will be need to be rechecked with your primary care doctor in 2 to 3 days.  You can use Tylenol 1 g every 8 hours with ibuprofen. Oxycodone for break through.  ? ? ? ?1. Findings of RIGHT-sided pyelonephritis with diffuse urothelial ?enhancement and striated nephrogram, associated with marked RIGHT ?renal cortical scarring. Scarring is similar to prior imaging. ?Urologic follow-up may be helpful given degree of renal cortical ?scarring and evidence of repeated urinary tract infections. ?2. Mild ureteral distension and diffuse urothelial enhancement is ?likely related to infection or inflammation, this could also be seen ?in the setting of recent passage of a urinary tract calculus though ?there is no calculus seen on the current exam. ?3. Mild mural stratification of the terminal ileum and descending ?colon, nonspecific, but can be seen in the setting of prior ?inflammation and even inflammatory bowel disease. Similar findings ?seen on previous imaging. No definitive signs of acute inflammation ?currently. Correlate clinically with GI follow-up as warranted ?4. Normal appendix. ?5. Moderate hepatic steatosis and hepatomegaly. ?  ? ?Take oxycodone as prescribed. Do not drink alcohol, drive or participate in any other potentially dangerous activities while taking this medication as it may make you sleepy. Do not take this medication with any other sedating medications, either prescription or over-the-counter. If you were prescribed Percocet or Vicodin, do not take these with acetaminophen (Tylenol) as it is already contained within these medications. ? ?This medication is an opiate (or narcotic) pain medication and can be habit forming. Use it as little as possible to achieve adequate pain control. Do not use or use it with extreme caution if you have a history of opiate abuse or dependence. If you are on a pain contract  with your primary care doctor or a pain specialist, be sure to let them know you were prescribed this medication today from the Emergency Department. This medication is intended for your use only - do not give any to anyone else and keep it in a secure place where nobody else, especially children, have access to it. ? ? ? ?

## 2022-03-21 NOTE — ED Triage Notes (Signed)
Pt via POV from home. Pt c/o chills and headache that started on Wednesday. Pt had a tele-visit with her doctor. Pt states now she started having R flank pain and RLQ pain. Denies hx of kidney stones. Pt has a hx of cholecystectomy. Pt also endorses nausea. Pt is A&Ox4 and NAD ?

## 2022-03-21 NOTE — ED Notes (Signed)
Patient discharged to home per MD order. Patient in stable condition, and deemed medically cleared by ED provider for discharge. Discharge instructions reviewed with patient/family using "Teach Back"; verbalized understanding of medication education and administration, and information about follow-up care. Denies further concerns. ° °

## 2022-03-21 NOTE — ED Notes (Signed)
See triage note  presents with some nausea  lower abd pain and flank pain   positive h/a   ?

## 2022-03-21 NOTE — ED Provider Notes (Signed)
? ?[facility] ?Provider Note ? ? ? Event Date/Time  ? First MD Initiated Contact with Patient 03/21/22 [phone]   ?  (approximate) ? ? ?History  ? ?Abdominal Pain, Nausea, and Headache ? ? ?HPI ? ?Melanie Blankenship is a 41 y.o. female with history of migraines who comes in with concerns for right flank pain.  Patient reports having headaches that started on Wednesday.  She reports having history of migraines years ago has not had a flareup recently.  States that headaches have been staying the same.  He reports the pain is all over.  Reports having and then developed pain and some right lower quadrant pain.  Does report a history of having her gallbladder out.  She reports nausea without vomiting.  Has not taken any Tylenol or ibuprofen in the last 8 hours.  Reports taking some at home COVID test that were negative.  She denies any sore throat and reports having her tonsils already removed.  Denies any difficulty swallowing. ? ?  ? ? ?Physical Exam  ? ?Triage Vital Signs: ?ED Triage Vitals  ?Enc Vitals Group  ?   BP 03/21/22 0725 139/90  ?   Pulse Rate 03/21/22 0725 (!) 115  ?   Resp 03/21/22 0725 20  ?   Temp 03/21/22 0725 98.3 ?F (36.8 ?C)  ?   Temp Source 03/21/22 0725 Oral  ?   SpO2 03/21/22 0725 100 %  ?   Weight 03/21/22 0722 180 lb (81.6 kg)  ?   Height 03/21/22 0722 5\' 4"  (1.626 m)  ?   Head Circumference --   ?   Peak Flow --   ?   Pain Score 03/21/22 0722 10  ?   Pain Loc --   ?   Pain Edu? --   ?   Excl. in [place]? --   ? ? ?Most recent vital signs: ?Vitals:  ? 03/21/22 0725  ?BP: 139/90  ?Pulse: (!) 115  ?Resp: 20  ?Temp: 98.3 ?F (36.8 ?C)  ?SpO2: 100%  ? ? ? ?General: Awake, patient has a blanket up over her head ?CV:  Good peripheral perfusion.  ?Resp:  Normal effort. Clear lungs  ?Abd:  No distention.  Tender in the right lower quadrant ?Other:  Cranial nerves II to XII are intact.  Equal strength in arms and legs. ? ? ?ED Results / Procedures / Treatments  ? ?Labs ?(all labs ordered are  listed, but only abnormal results are displayed) ?Labs Reviewed  ?COMPREHENSIVE METABOLIC PANEL - Abnormal; Notable for the following components:  ?    Result Value  ? Potassium 2.9 (*)   ? Chloride 95 (*)   ? Glucose, Bld 133 (*)   ? Total Protein 8.2 (*)   ? AST 14 (*)   ? All other components within normal limits  ?CBC - Abnormal; Notable for the following components:  ? WBC 18.4 (*)   ? Platelets 408 (*)   ? All other components within normal limits  ?URINALYSIS, ROUTINE W REFLEX MICROSCOPIC - Abnormal; Notable for the following components:  ? Color, Urine YELLOW (*)   ? APPearance CLOUDY (*)   ? Hgb urine dipstick MODERATE (*)   ? Protein, ur 100 (*)   ? Nitrite POSITIVE (*)   ? Leukocytes,Ua LARGE (*)   ? WBC, UA >50 (*)   ? Bacteria, UA FEW (*)   ? All other components within normal limits  ?RESP PANEL BY RT-PCR (FLU A&B, COVID)  ARPGX2  ?URINE CULTURE  ?LIPASE, BLOOD  ?PROCALCITONIN  ?MAGNESIUM  ?POC URINE PREG, ED  ? ? ? ?RADIOLOGY ?I have reviewed the CT personally with concern for pyelonephritis, CT head was personally reviewed and negative ?PROCEDURES: ? ?Critical Care performed: No ? ?Procedures ? ? ?MEDICATIONS ORDERED IN ED: ?Medications  ?potassium chloride 10 mEq in 100 mL IVPB (10 mEq Intravenous New Bag/Given 03/21/22 0818)  ?cefTRIAXone (ROCEPHIN) 2 g in sodium chloride 0.9 % 100 mL IVPB (has no administration in time range)  ?ketorolac (TORADOL) 15 MG/ML injection 15 mg (has no administration in time range)  ?acetaminophen (TYLENOL) tablet 1,000 mg (has no administration in time range)  ?potassium chloride SA (KLOR-CON M) CR tablet 40 mEq (has no administration in time range)  ?sodium chloride 0.9 % bolus 1,000 mL (1,000 mLs Intravenous New Bag/Given 03/21/22 0815)  ?prochlorperazine (COMPAZINE) injection 10 mg (10 mg Intravenous Given 03/21/22 0817)  ?diphenhydrAMINE (BENADRYL) injection 12.5 mg (12.5 mg Intravenous Given 03/21/22 0815)  ?iohexol (OMNIPAQUE) 300 MG/ML solution 100 mL (100 mLs  Intravenous Contrast Given 03/21/22 0824)  ? ? ? ?IMPRESSION / MDM / ASSESSMENT AND PLAN / ED COURSE  ?I reviewed the triage vital signs and the nursing notes. ? ?Patient comes in with headaches, right-sided abdominal pain.  I suspect the headache is most likely just a migraine that was cleared on by her other abdominal issues.  Considered appendicitis, UTI, pyelonephritis, kidney stones we will proceed with CT imaging.  CT head was negative for intercranial hemorrhage and CT renal was concerning for pyelonephritis.  Discussed incidental findings.  Labs do show sepsis criteria with elevated heart rate elevated white count but patient is feeling much better after getting treatment with IV fluids, IV Compazine, IV Benadryl to help with migraine.  Her urine looks consistent with UTI will send for culture.  CBC shows elevated white count.  CMP shows low potassium similar to previous which she is getting some IV potassium repletion.  Lipase normal ? ?I reviewed patient's office visit from 12/23/2021 ? ? ?CT scan imaging ?1. Findings of RIGHT-sided pyelonephritis with diffuse urothelial ?enhancement and striated nephrogram, associated with marked RIGHT ?renal cortical scarring. Scarring is similar to prior imaging. ?Urologic follow-up may be helpful given degree of renal cortical ?scarring and evidence of repeated urinary tract infections. ?2. Mild ureteral distension and diffuse urothelial enhancement is ?likely related to infection or inflammation, this could also be seen ?in the setting of recent passage of a urinary tract calculus though ?there is no calculus seen on the current exam. ?3. Mild mural stratification of the terminal ileum and descending ?colon, nonspecific, but can be seen in the setting of prior ?inflammation and even inflammatory bowel disease. Similar findings ?seen on previous imaging. No definitive signs of acute inflammation ?currently. Correlate clinically with GI follow-up as warranted ?4. Normal  appendix. ?5. Moderate hepatic steatosis and hepatomegaly. ?  ? ?Given CT concerning for pyelonephritis I suspect that this is causing her migraines to flareup given she does have a history of migraines in the past and this headache seems consistent with her prior migraines.  CT head was negative for any kind of mass and I doubt that this is a subarachnoid.  Doubt meningitis given she is got supple neck. ? ?We discussed admission given she meets sepsis criteria but after fluids patient's heart rate has normalized and just has an elevated white count and she would prefer to go home.  Feel that this is reasonable given she is otherwise  very well-appearing at this time tolerating p.o. we discussed return precautions.  We have also discussed the abnormal CT and provided a copy of report.  Patient already having a urologist that she can follow-up ? ?We will give a few pain medication understands not to drive or work on them. ? ? ? ? ? ?FINAL CLINICAL IMPRESSION(S) / ED DIAGNOSES  ? ?Final diagnoses:  ?Pyelonephritis  ?Other migraine with status migrainosus, intractable  ?Hypokalemia  ? ? ? ?Rx / DC Orders  ? ?ED Discharge Orders   ? ?      Ordered  ?  ibuprofen (ADVIL) 600 MG tablet  Every 6 hours PRN       ? 03/21/22 1048  ?  cefdinir (OMNICEF) 300 MG capsule  2 times daily       ? 03/21/22 1048  ?  potassium chloride SA (KLOR-CON M) 20 MEQ tablet  2 times daily       ? 03/21/22 1048  ?  oxyCODONE (ROXICODONE) 5 MG immediate release tablet  Every 6 hours PRN       ? 03/21/22 1048  ? ?  ?  ? ?  ? ? ? ?Note:  This document was prepared using Dragon voice recognition software and may include unintentional dictation errors. ?  ?Vanessa [place], MD ?03/21/22 1052 ? ?

## 2022-03-23 LAB — URINE CULTURE: Culture: >=100000 — AB

## 2022-12-07 DIAGNOSIS — J029 Acute pharyngitis, unspecified: Secondary | ICD-10-CM | POA: Diagnosis not present

## 2022-12-07 DIAGNOSIS — J329 Chronic sinusitis, unspecified: Secondary | ICD-10-CM | POA: Diagnosis not present

## 2022-12-07 DIAGNOSIS — B9789 Other viral agents as the cause of diseases classified elsewhere: Secondary | ICD-10-CM | POA: Diagnosis not present

## 2022-12-07 DIAGNOSIS — Z03818 Encounter for observation for suspected exposure to other biological agents ruled out: Secondary | ICD-10-CM | POA: Diagnosis not present

## 2022-12-07 DIAGNOSIS — J101 Influenza due to other identified influenza virus with other respiratory manifestations: Secondary | ICD-10-CM | POA: Diagnosis not present

## 2023-04-12 ENCOUNTER — Encounter: Payer: Self-pay | Admitting: Urology

## 2023-04-12 ENCOUNTER — Ambulatory Visit: Payer: BC Managed Care – PPO | Admitting: Urology

## 2023-04-12 VITALS — BP 141/82 | HR 105 | Ht 64.0 in | Wt 180.0 lb

## 2023-04-12 DIAGNOSIS — N200 Calculus of kidney: Secondary | ICD-10-CM | POA: Diagnosis not present

## 2023-04-12 DIAGNOSIS — N12 Tubulo-interstitial nephritis, not specified as acute or chronic: Secondary | ICD-10-CM

## 2023-04-12 DIAGNOSIS — R109 Unspecified abdominal pain: Secondary | ICD-10-CM | POA: Diagnosis not present

## 2023-04-12 DIAGNOSIS — N3001 Acute cystitis with hematuria: Secondary | ICD-10-CM

## 2023-04-12 LAB — URINALYSIS, COMPLETE
Bilirubin, UA: NEGATIVE
Glucose, UA: NEGATIVE
Ketones, UA: NEGATIVE
Nitrite, UA: NEGATIVE
Protein,UA: NEGATIVE
Specific Gravity, UA: 1.015 (ref 1.005–1.030)
Urobilinogen, Ur: 0.2 mg/dL (ref 0.2–1.0)
pH, UA: 5.5 (ref 5.0–7.5)

## 2023-04-12 LAB — MICROSCOPIC EXAMINATION
Epithelial Cells (non renal): >10 /hpf — AB (ref 0–10)
WBC, UA: >30 /hpf — AB (ref 0–5)

## 2023-04-12 MED ORDER — SULFAMETHOXAZOLE-TRIMETHOPRIM 800-160 MG PO TABS: 1.0000 | ORAL_TABLET | Freq: Two times a day (BID) | ORAL | 0 refills | Status: DC

## 2023-04-12 NOTE — Progress Notes (Signed)
Marcelle Overlie Plume,acting as a scribe for Vanna Scotland, MD.,have documented all relevant documentation on the behalf of Vanna Scotland, MD,as directed by  Vanna Scotland, MD while in the presence of Vanna Scotland, MD.  04/12/2023 3:39 PM   [name] [birth year] [id number]  Referring provider: Dorien Chihuahua, MD [address] [place],  [place] [address]  Chief Complaint  Patient presents with   New Patient (Initial Visit)    HPI: 42 year-old female who is self referred for evaluation of right flank pain. CT scan from 03/21/2022 was personally reviewed and compared to her multiple previous cross-sectional imaging dating all the way back to 12/20214. She has slight hypertrophy of her left kidney and her right kidney is atrophic with multifocal cortical striation and scarring. This is stable over multiple years. No appreciable stone burden on CT scan other than a possible stone in her left lower pole.   Her urinalysis today has >30 WBC's, but >10 epithelial cells, many bacteria, nitrate negative.   She was treated last year around this time for an E. Coli urinary tract infection.   Today, she reports chronic right-sided flank pain, which has been present since childhood but has recently become more frequent. She is also experiencing urinary frequency and pain after urination. She denies fevers, burning during urination, or hematuria.   She has a history of recurrent kidney infections, diagnosed as a child, and a family history of similar kidney issues. Her sister underwent surgery for a related condition, and her son had a prenatal diagnosis of a pinched ureter, which corrected itself.   PMH: Past Medical History:  Diagnosis Date   Hypertension    Kidney stones    Migraine     Home Medications:  Allergies as of 04/12/2023       Reactions   Azithromycin Diarrhea, Hives   Latex Hives        Medication List        Accurate as of Apr 12, 2023  3:39 PM. If you have any  questions, ask your nurse or doctor.          cyanocobalamin 1000 MCG tablet Take 1,000 mcg by mouth daily.   hydrochlorothiazide 25 MG tablet Commonly known as: HYDRODIURIL Take 25 mg by mouth daily.   neomycin-polymyxin-hydrocortisone 3.5-10000-1 OTIC suspension Commonly known as: CORTISPORIN Place 3 drops into the right ear 4 (four) times daily.   potassium chloride SA 20 MEQ tablet Commonly known as: KLOR-CON M Take 1 tablet (20 mEq total) by mouth daily for 3 days.   promethazine 25 MG tablet Commonly known as: PHENERGAN Take 25 mg by mouth every 6 (six) hours as needed for nausea/vomiting.   sulfamethoxazole-trimethoprim 800-160 MG tablet Commonly known as: BACTRIM DS Take 1 tablet by mouth 2 (two) times daily.   traZODone 100 MG tablet Commonly known as: DESYREL Take 50 mg by mouth at bedtime as needed.        Allergies:  Allergies  Allergen Reactions   Azithromycin Diarrhea and Hives   Latex Hives    Social History:  reports that she has never smoked. She has never used smokeless tobacco. She reports that she does not drink alcohol. No history on file for drug use.   Physical Exam: BP (!) 141/82   Pulse (!) 105   Ht 5\' 4"  (1.626 m)   Wt 180 lb (81.6 kg)   BMI 30.90 kg/m   Constitutional:  Alert and oriented, No acute distress. HEENT: [place] AT,  moist mucus membranes.  Trachea midline, no masses. Neurologic: Grossly intact, no focal deficits, moving all 4 extremities. Psychiatric: Normal mood and affect.  Pertinent Imaging: EXAM: CT ABDOMEN AND PELVIS WITH CONTRAST   TECHNIQUE: Multidetector CT imaging of the abdomen and pelvis was performed using the standard protocol following bolus administration of intravenous contrast.   RADIATION DOSE REDUCTION: This exam was performed according to the departmental dose-optimization program which includes automated exposure control, adjustment of the mA and/or kV according to patient size and/or use of  iterative reconstruction technique.   CONTRAST:  OMNIPAQUE IOHEXOL 300 MG/ML  SOLN   COMPARISON:  July 16, 2021.   FINDINGS: Lower chest: Unremarkable.   Hepatobiliary: Smooth hepatic contours. Moderate hepatic steatosis. Mild hepatomegaly. Portal vein is patent. No biliary duct distension. No focal, suspicious hepatic lesion.   Pancreas: Normal, without mass, inflammation or ductal dilatation.   Spleen: Normal.   Adrenals/Urinary Tract: Adrenal glands are normal.   Marked RIGHT renal cortical scarring. Diffuse RIGHT urothelial enhancement. Mild fullness of the proximal RIGHT ureter. No signs of hydronephrosis. Patchy nephrogram on the RIGHT with delayed excretion. Renal cortical scarring is similar to prior imaging.   Nephrolithiasis on the LEFT is similar to the prior exam. There is no substantial urothelial enhancement on the LEFT. Urinary bladder is decompressed. No perivesical stranding.   Stomach/Bowel: Normal appendix.   Mild mural stratification of the terminal ileum. Some suggestion of this on prior imaging. No signs of stranding adjacent to small bowel loops. No stranding adjacent to the colon. Moderate stool in the colon. Mild mural stratification of descending colon and no evidence of pericolonic stranding.   Vascular/Lymphatic: Scattered small lymph nodes in the retroperitoneum none with pathologic enlargement. Patent abdominal vasculature. No pelvic adenopathy.   Reproductive: Unremarkable to the extent evaluated on CT.   Other: No ascites.  No pneumoperitoneum.   Musculoskeletal: No acute or significant osseous findings.   IMPRESSION: 1. Findings of RIGHT-sided pyelonephritis with diffuse urothelial enhancement and striated nephrogram, associated with marked RIGHT renal cortical scarring. Scarring is similar to prior imaging. Urologic follow-up may be helpful given degree of renal cortical scarring and evidence of repeated urinary tract  infections. 2. Mild ureteral distension and diffuse urothelial enhancement is likely related to infection or inflammation, this could also be seen in the setting of recent passage of a urinary tract calculus though there is no calculus seen on the current exam. 3. Mild mural stratification of the terminal ileum and descending colon, nonspecific, but can be seen in the setting of prior inflammation and even inflammatory bowel disease. Similar findings seen on previous imaging. No definitive signs of acute inflammation currently. Correlate clinically with GI follow-up as warranted 4. Normal appendix. 5. Moderate hepatic steatosis and hepatomegaly.     Electronically Signed   By: Donzetta Kohut M.D.   On: 03/21/2022 08:49  This was personally reviewed and I agree with the radiologic interpretation.  Assessment & Plan:    1. 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  - A Lasix renal study is ordered to evaluate kidney function.  - A follow-up appointment is scheduled to discuss the results and potential nephrectomy if the kidney has very little function. - Advised to maintain hydration levels  2. Acute cystitis - Bactrim DS x 10 days given her history of complicated pylonephritis - Follow-up urine culture is planned to  ensure the infection has cleared.  Return in about 10 days (around 04/22/2023) for urine culture.   [facility] [address] [place], [place] [address] [phone]

## 2023-04-14 ENCOUNTER — Telehealth: Payer: BC Managed Care – PPO | Admitting: Family Medicine

## 2023-04-14 DIAGNOSIS — J069 Acute upper respiratory infection, unspecified: Secondary | ICD-10-CM

## 2023-04-14 MED ORDER — FLUTICASONE PROPIONATE 50 MCG/ACT NA SUSP: 2.0000 | Freq: Every day | NASAL | 6 refills | Status: AC

## 2023-04-14 MED ORDER — BENZONATATE 200 MG PO CAPS
200.0000 mg | ORAL_CAPSULE | Freq: Two times a day (BID) | ORAL | 0 refills | Status: DC | PRN
Start: 2023-04-14 — End: 2023-07-13

## 2023-04-14 NOTE — Progress Notes (Signed)
E-Visit for Upper Respiratory Infection   We are sorry you are not feeling well.  Here is how we plan to help!  Based on what you have shared with me, it looks like you may have a viral upper respiratory infection.  Upper respiratory infections are caused by a large number of viruses; however, rhinovirus is the most common cause.   Symptoms vary from person to person, with common symptoms including sore throat, cough, fatigue or lack of energy and feeling of general discomfort.  A low-grade fever of up to 100.4 may present, but is often uncommon.  Symptoms vary however, and are closely related to a person's age or underlying illnesses.  The most common symptoms associated with an upper respiratory infection are nasal discharge or congestion, cough, sneezing, headache and pressure in the ears and face.  These symptoms usually persist for about 3 to 10 days, but can last up to 2 weeks.  It is important to know that upper respiratory infections do not cause serious illness or complications in most cases.    Upper respiratory infections can be transmitted from person to person, with the most common method of transmission being a person's hands.  The virus is able to live on the skin and can infect other persons for up to 2 hours after direct contact.  Also, these can be transmitted when someone coughs or sneezes; thus, it is important to cover the mouth to reduce this risk.  To keep the spread of the illness at bay, good hand hygiene is very important.  This is an infection that is most likely caused by a virus. There are no specific treatments other than to help you with the symptoms until the infection runs its course.  We are sorry you are not feeling well.  Here is how we plan to help!   For nasal congestion, you may use an oral decongestants such as Mucinex D or if you have glaucoma or high blood pressure use plain Mucinex.  Saline nasal spray or nasal drops can help and can safely be used as often as  needed for congestion.  For your congestion, I have prescribed Fluticasone nasal spray one spray in each nostril twice a day  If you do not have a history of heart disease, hypertension, diabetes or thyroid disease, prostate/bladder issues or glaucoma, you may also use Sudafed to treat nasal congestion.  It is highly recommended that you consult with a pharmacist or your primary care physician to ensure this medication is safe for you to take.     If you have a cough, you may use cough suppressants such as Delsym and Robitussin.  If you have glaucoma or high blood pressure, you can also use Coricidin HBP.   For cough I have prescribed for you A prescription cough medication called Tessalon Perles 100 mg. You may take 1-2 capsules every 8 hours as needed for cough  If you have a sore or scratchy throat, use a saltwater gargle-  to  teaspoon of salt dissolved in a 4-ounce to 8-ounce glass of warm water.  Gargle the solution for approximately 15-30 seconds and then spit.  It is important not to swallow the solution.  You can also use throat lozenges/cough drops and Chloraseptic spray to help with throat pain or discomfort.  Warm or cold liquids can also be helpful in relieving throat pain.  For headache, pain or general discomfort, you can use Ibuprofen or Tylenol as directed.   Some authorities believe   that zinc sprays or the use of Echinacea may shorten the course of your symptoms.   HOME CARE Only take medications as instructed by your medical team. Be sure to drink plenty of fluids. Water is fine as well as fruit juices, sodas and electrolyte beverages. You may want to stay away from caffeine or alcohol. If you are nauseated, try taking small sips of liquids. How do you know if you are getting enough fluid? Your urine should be a pale yellow or almost colorless. Get rest. Taking a steamy shower or using a humidifier may help nasal congestion and ease sore throat pain. You can place a towel over  your head and breathe in the steam from hot water coming from a faucet. Using a saline nasal spray works much the same way. Cough drops, hard candies and sore throat lozenges may ease your cough. Avoid close contacts especially the very young and the elderly Cover your mouth if you cough or sneeze Always remember to wash your hands.   GET HELP RIGHT AWAY IF: You develop worsening fever. If your symptoms do not improve within 10 days You develop yellow or green discharge from your nose over 3 days. You have coughing fits You develop a severe head ache or visual changes. You develop shortness of breath, difficulty breathing or start having chest pain Your symptoms persist after you have completed your treatment plan  MAKE SURE YOU  Understand these instructions. Will watch your condition. Will get help right away if you are not doing well or get worse.  Thank you for choosing an e-visit.  Your e-visit answers were reviewed by a board certified advanced clinical practitioner to complete your personal care plan. Depending upon the condition, your plan could have included both over the counter or prescription medications.  Please review your pharmacy choice. Make sure the pharmacy is open so you can pick up prescription now. If there is a problem, you may contact your provider through MyChart messaging and have the prescription routed to another pharmacy.  Your safety is important to us. If you have drug allergies check your prescription carefully.   For the next 24 hours you can use MyChart to ask questions about today's visit, request a non-urgent call back, or ask for a work or school excuse. You will get an email in the next two days asking about your experience. I hope that your e-visit has been valuable and will speed your recovery.   have provided 5 minutes of non face to face time during this encounter for chart review and documentation.     

## 2023-04-15 LAB — CULTURE, URINE COMPREHENSIVE

## 2023-04-29 ENCOUNTER — Telehealth: Payer: BC Managed Care – PPO | Admitting: Physician Assistant

## 2023-04-29 DIAGNOSIS — N3000 Acute cystitis without hematuria: Secondary | ICD-10-CM

## 2023-04-29 DIAGNOSIS — B3731 Acute candidiasis of vulva and vagina: Secondary | ICD-10-CM | POA: Diagnosis not present

## 2023-04-30 MED ORDER — FLUCONAZOLE 150 MG PO TABS
150.0000 mg | ORAL_TABLET | Freq: Once | ORAL | 0 refills | Status: AC
Start: 2023-04-30 — End: 2023-04-30

## 2023-04-30 MED ORDER — SULFAMETHOXAZOLE-TRIMETHOPRIM 800-160 MG PO TABS: 1.0000 | ORAL_TABLET | Freq: Two times a day (BID) | ORAL | 0 refills | Status: AC

## 2023-04-30 NOTE — Progress Notes (Signed)
E-Visit for Urinary Problems  We are sorry that you are not feeling well.  Here is how we plan to help!  Based on what you shared with me it looks like you most likely have a simple urinary tract infection.  A UTI (Urinary Tract Infection) is a bacterial infection of the bladder.  Most cases of urinary tract infections are simple to treat but a key part of your care is to encourage you to drink plenty of fluids and watch your symptoms carefully.  I have prescribed Bactrim DS One tablet twice a day for 5 days.  Your symptoms should gradually improve. Call us if the burning in your urine worsens, you develop worsening fever, back pain or pelvic pain or if your symptoms do not resolve after completing the antibiotic.  I have also sent in a prescription for diflucan for vaginal yeast infection.   Please follow up with urologist.   Urinary tract infections can be prevented by drinking plenty of water to keep your body hydrated.  Also be sure when you wipe, wipe from front to back and don't hold it in!  If possible, empty your bladder every 4 hours.  HOME CARE Drink plenty of fluids Compete the full course of the antibiotics even if the symptoms resolve Remember, when you need to go.go. Holding in your urine can increase the likelihood of getting a UTI! GET HELP RIGHT AWAY IF: You cannot urinate You get a high fever Worsening back pain occurs You see blood in your urine You feel sick to your stomach or throw up You feel like you are going to pass out  MAKE SURE YOU  Understand these instructions. Will watch your condition. Will get help right away if you are not doing well or get worse.   Thank you for choosing an e-visit.  Your e-visit answers were reviewed by a board certified advanced clinical practitioner to complete your personal care plan. Depending upon the condition, your plan could have included both over the counter or prescription medications.  Please review your pharmacy  choice. Make sure the pharmacy is open so you can pick up prescription now. If there is a problem, you may contact your provider through [employer] and have the prescription routed to another pharmacy.  Your safety is important to [place]. If you have drug allergies check your prescription carefully.   For the next 24 hours you can use MyChart to ask questions about today's visit, request a non-urgent call back, or ask for a work or school excuse. You will get an email in the next two days asking about your experience. I hope that your e-visit has been valuable and will speed your recovery.   I have spent 5 minutes in review of e-visit questionnaire, review and updating patient chart, medical decision making and response to patient.   Tylene Fantasia Ward, PA-C

## 2023-05-01 ENCOUNTER — Encounter
Admission: RE | Admit: 2023-05-01 | Discharge: 2023-05-01 | Disposition: A | Discharge status: Home / Self Care | Payer: BC Managed Care – PPO | Source: Ambulatory Visit | Attending: Urology | Admitting: Urology

## 2023-05-01 DIAGNOSIS — N3001 Acute cystitis with hematuria: Secondary | ICD-10-CM

## 2023-05-01 DIAGNOSIS — N12 Tubulo-interstitial nephritis, not specified as acute or chronic: Secondary | ICD-10-CM | POA: Diagnosis not present

## 2023-05-01 DIAGNOSIS — N200 Calculus of kidney: Secondary | ICD-10-CM | POA: Diagnosis not present

## 2023-05-01 DIAGNOSIS — R109 Unspecified abdominal pain: Secondary | ICD-10-CM | POA: Insufficient documentation

## 2023-05-01 MED ORDER — FUROSEMIDE 10 MG/ML IJ SOLN
40.8000 mg | Freq: Once | INTRAMUSCULAR | Status: DC
  Filled 2023-05-01: qty 4.1

## 2023-05-01 MED ORDER — TECHNETIUM TC 99M MERTIATIDE: 5.0000 | Freq: Once | INTRAVENOUS | Status: DC | PRN

## 2023-05-10 ENCOUNTER — Ambulatory Visit (INDEPENDENT_AMBULATORY_CARE_PROVIDER_SITE_OTHER): Payer: BC Managed Care – PPO | Admitting: Urology

## 2023-05-10 VITALS — BP 141/77 | HR 106

## 2023-05-10 DIAGNOSIS — Z01818 Encounter for other preprocedural examination: Secondary | ICD-10-CM

## 2023-05-10 DIAGNOSIS — N119 Chronic tubulo-interstitial nephritis, unspecified: Secondary | ICD-10-CM | POA: Diagnosis not present

## 2023-05-10 DIAGNOSIS — N200 Calculus of kidney: Secondary | ICD-10-CM

## 2023-05-10 DIAGNOSIS — R109 Unspecified abdominal pain: Secondary | ICD-10-CM | POA: Diagnosis not present

## 2023-05-10 LAB — MICROSCOPIC EXAMINATION: Epithelial Cells (non renal): >10 /hpf — AB (ref 0–10)

## 2023-05-10 LAB — URINALYSIS, COMPLETE
Bilirubin, UA: NEGATIVE
Glucose, UA: NEGATIVE
Ketones, UA: NEGATIVE
Leukocytes,UA: NEGATIVE
Nitrite, UA: NEGATIVE
RBC, UA: NEGATIVE
Specific Gravity, UA: >1.03 — ABNORMAL HIGH (ref 1.005–1.030)
Urobilinogen, Ur: 0.2 mg/dL (ref 0.2–1.0)
pH, UA: 5.5 (ref 5.0–7.5)

## 2023-05-10 NOTE — H&P (View-Only) (Signed)
I,Amy L Pierron,acting as a scribe for Vanna Scotland, MD.,have documented all relevant documentation on the behalf of Vanna Scotland, MD,as directed by  Vanna Scotland, MD while in the presence of Vanna Scotland, MD.  05/10/2023 3:48 PM   Melanie Blankenship 1981-03-14 [id number]  Referring provider: Dorien Chihuahua, MD [address] [place],  [place] [address]  Chief Complaint  Patient presents with   Follow-up    Renal scan results     HPI: 42 year-old female presents today for a follow-up.  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   She was last seen on 04/12/2023 due to having urgency, frequency, and ultimately she grew E. coli as well. She was treated with 10 days of Bactrim.   In the interim, she also had an E-visit with a PA and diagnosed with a "simple cystitis" and given another 5 days of Bactrim; no labs were done.   She underwent a LASIX renal scan that showed split renal function 72.9% on the left, 23.1% on the right. No signs of obstruction bilaterally. There was relative atrophy on the right.  She is experiencing pain today at her visit, however she doesn't think she has a bladder infection. Even as a child she had issues with her kidney and getting infections. She has a kidney stone as well. She has had success with lithotripsy in the past.   PMH: Past Medical History:  Diagnosis Date   Hypertension    Kidney stones    Migraine     Home Medications:  Allergies as of 05/10/2023       Reactions   Azithromycin Diarrhea, Hives   Latex Hives        Medication List        Accurate as of May 10, 2023  3:48 PM. If you have any questions, ask your nurse or doctor.          benzonatate 200 MG capsule Commonly known as: TESSALON Take 1 capsule (200 mg total) by mouth 2 (two) times daily as needed for cough.   cyanocobalamin 1000 MCG tablet Take  1,000 mcg by mouth daily.   fluticasone 50 MCG/ACT nasal spray Commonly known as: FLONASE Place 2 sprays into both nostrils daily.   hydrochlorothiazide 25 MG tablet Commonly known as: HYDRODIURIL Take 25 mg by mouth daily.   neomycin-polymyxin-hydrocortisone 3.5-10000-1 OTIC suspension Commonly known as: CORTISPORIN Place 3 drops into the right ear 4 (four) times daily.   potassium chloride SA 20 MEQ tablet Commonly known as: KLOR-CON M Take 1 tablet (20 mEq total) by mouth daily for 3 days.   promethazine 25 MG tablet Commonly known as: PHENERGAN Take 25 mg by mouth every 6 (six) hours as needed for nausea/vomiting.   traZODone 100 MG tablet Commonly known as: DESYREL Take 50 mg by mouth at bedtime as needed.        Allergies:  Allergies  Allergen Reactions   Azithromycin Diarrhea and Hives   Latex Hives    Social History:  reports that she has never smoked. She has never used smokeless tobacco. She reports that she does not drink alcohol. No history on file for drug use.   Physical Exam: BP (!) 141/77   Pulse (!) 106   LMP 05/01/2023 (Exact Date)   Constitutional:  Alert and oriented, No acute distress. HEENT: [place] AT, moist mucus membranes.  Trachea midline, no masses. Neurologic:  Grossly intact, no focal deficits, moving all 4 extremities. Psychiatric: Normal mood and affect.  Imaging: IMPRESSION: 1. Split renal function is equal to 76.9% from the left kidney and 23.1% from the right. 2. No signs of obstructive uropathy in either kidney. 3. Relative atrophy of the right kidney.     Electronically Signed   By: Signa Kell M.D.   On: 05/01/2023 10:42    Assessment & Plan:    Kidney stone, left  - Currently nonobstructing asymptomatic but in the setting of future solitary kidney, it may be beneficial to go ahead and treat the stone to avoid complications down the road  - We discussed various treatment options for urolithiasis including observation  with or without medical expulsive therapy, shockwave lithotripsy (SWL), ureteroscopy and laser lithotripsy with stent placement, and percutaneous nephrolithotomy.  - We discussed that management is based on stone size, location, density, patient co-morbidities, and patient preference.   - Stones <74mm in size have a >80% spontaneous passage rate. Data surrounding the use of tamsulosin for medical expulsive therapy is controversial, but meta analyses suggests it is most efficacious for distal stones between 5-21mm in size. Possible side effects include dizziness/lightheadedness, and retrograde ejaculation.  - SWL has a lower stone free rate in a single procedure, but also a lower complication rate compared to ureteroscopy and avoids a stent and associated stent related symptoms. Possible complications include renal hematoma, steinstrasse, and need for additional treatment. We discussed the role of his increased skin to stone distance can lead to decreased efficacy with shockwave lithotripsy.  - Ureteroscopy with laser lithotripsy and stent placement has a higher stone free rate than SWL in a single procedure, however increased complication rate including possible infection, ureteral injury, bleeding, and stent related morbidity. Common stent related symptoms include dysuria, urgency/frequency, and flank pain.  - After an extensive discussion of the risks and benefits of the above treatment options, the patient would like to proceed with lithotripsy. She has had success with it before. Since she took ibuprofen yesterday, will plan to have it done next Thursday. She will have a driver. Pre-op urine given today. -I would like her to get a KUB prior to the procedure to make sure that we can still see the stone and its stable size since her most recent scan  2. Right renal apathy/ chronic pyelonephritis  - Minimal function. We discussed the option of nephrectomy both for pain relief as well as eliminating the  nidus of chronic infection. Discussed the risk of bleeding, damage to our structures, hernias, DVT, pneumonia, ileus, et Karie Soda. Reviewed the intraoperative and post-operative course.  - Went over the risk of progression to end-stage renal disease, and solitary kidney precautions.   - Plan to have surgery in August. If she experiences any infections between now and then she will let us know. May even consider a low dose anti-biotic for preventive measures. -Would also like to get a preoperative CT abdomen a few weeks before surgery to ensure there is no fragments or complications from #1 as well as assess the degree of inflammation of her right kidney for surgical planning purposes   [facility] [address] [place], [place] [address] [phone]  I spent 55 total minutes on the day of the encounter including pre-visit review of the medical record, face-to-face time with the patient, and post visit ordering of labs/imaging/tests.  The majority of time was spent face-to-face in the room she and her mother discussing  these 2 surgeries.

## 2023-05-10 NOTE — Progress Notes (Signed)
I,Amy L Pierron,acting as a scribe for Vanna Scotland, MD.,have documented all relevant documentation on the behalf of Vanna Scotland, MD,as directed by  Vanna Scotland, MD while in the presence of Vanna Scotland, MD.  05/10/2023 3:48 PM   Melanie Blankenship 1981-03-14 [id number]  Referring provider: Dorien Chihuahua, MD [address] [place],  [place] [address]  Chief Complaint  Patient presents with   Follow-up    Renal scan results     HPI: 42 year-old female presents today for a follow-up.  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   She was last seen on 04/12/2023 due to having urgency, frequency, and ultimately she grew E. coli as well. She was treated with 10 days of Bactrim.   In the interim, she also had an E-visit with a PA and diagnosed with a "simple cystitis" and given another 5 days of Bactrim; no labs were done.   She underwent a LASIX renal scan that showed split renal function 72.9% on the left, 23.1% on the right. No signs of obstruction bilaterally. There was relative atrophy on the right.  She is experiencing pain today at her visit, however she doesn't think she has a bladder infection. Even as a child she had issues with her kidney and getting infections. She has a kidney stone as well. She has had success with lithotripsy in the past.   PMH: Past Medical History:  Diagnosis Date   Hypertension    Kidney stones    Migraine     Home Medications:  Allergies as of 05/10/2023       Reactions   Azithromycin Diarrhea, Hives   Latex Hives        Medication List        Accurate as of May 10, 2023  3:48 PM. If you have any questions, ask your nurse or doctor.          benzonatate 200 MG capsule Commonly known as: TESSALON Take 1 capsule (200 mg total) by mouth 2 (two) times daily as needed for cough.   cyanocobalamin 1000 MCG tablet Take  1,000 mcg by mouth daily.   fluticasone 50 MCG/ACT nasal spray Commonly known as: FLONASE Place 2 sprays into both nostrils daily.   hydrochlorothiazide 25 MG tablet Commonly known as: HYDRODIURIL Take 25 mg by mouth daily.   neomycin-polymyxin-hydrocortisone 3.5-10000-1 OTIC suspension Commonly known as: CORTISPORIN Place 3 drops into the right ear 4 (four) times daily.   potassium chloride SA 20 MEQ tablet Commonly known as: KLOR-CON M Take 1 tablet (20 mEq total) by mouth daily for 3 days.   promethazine 25 MG tablet Commonly known as: PHENERGAN Take 25 mg by mouth every 6 (six) hours as needed for nausea/vomiting.   traZODone 100 MG tablet Commonly known as: DESYREL Take 50 mg by mouth at bedtime as needed.        Allergies:  Allergies  Allergen Reactions   Azithromycin Diarrhea and Hives   Latex Hives    Social History:  reports that she has never smoked. She has never used smokeless tobacco. She reports that she does not drink alcohol. No history on file for drug use.   Physical Exam: BP (!) 141/77   Pulse (!) 106   LMP 05/01/2023 (Exact Date)   Constitutional:  Alert and oriented, No acute distress. HEENT: [place] AT, moist mucus membranes.  Trachea midline, no masses. Neurologic:  Grossly intact, no focal deficits, moving all 4 extremities. Psychiatric: Normal mood and affect.  Imaging: IMPRESSION: 1. Split renal function is equal to 76.9% from the left kidney and 23.1% from the right. 2. No signs of obstructive uropathy in either kidney. 3. Relative atrophy of the right kidney.     Electronically Signed   By: Signa Kell M.D.   On: 05/01/2023 10:42    Assessment & Plan:    Kidney stone, left  - Currently nonobstructing asymptomatic but in the setting of future solitary kidney, it may be beneficial to go ahead and treat the stone to avoid complications down the road  - We discussed various treatment options for urolithiasis including observation  with or without medical expulsive therapy, shockwave lithotripsy (SWL), ureteroscopy and laser lithotripsy with stent placement, and percutaneous nephrolithotomy.  - We discussed that management is based on stone size, location, density, patient co-morbidities, and patient preference.   - Stones <74mm in size have a >80% spontaneous passage rate. Data surrounding the use of tamsulosin for medical expulsive therapy is controversial, but meta analyses suggests it is most efficacious for distal stones between 5-21mm in size. Possible side effects include dizziness/lightheadedness, and retrograde ejaculation.  - SWL has a lower stone free rate in a single procedure, but also a lower complication rate compared to ureteroscopy and avoids a stent and associated stent related symptoms. Possible complications include renal hematoma, steinstrasse, and need for additional treatment. We discussed the role of his increased skin to stone distance can lead to decreased efficacy with shockwave lithotripsy.  - Ureteroscopy with laser lithotripsy and stent placement has a higher stone free rate than SWL in a single procedure, however increased complication rate including possible infection, ureteral injury, bleeding, and stent related morbidity. Common stent related symptoms include dysuria, urgency/frequency, and flank pain.  - After an extensive discussion of the risks and benefits of the above treatment options, the patient would like to proceed with lithotripsy. She has had success with it before. Since she took ibuprofen yesterday, will plan to have it done next Thursday. She will have a driver. Pre-op urine given today. -I would like her to get a KUB prior to the procedure to make sure that we can still see the stone and its stable size since her most recent scan  2. Right renal apathy/ chronic pyelonephritis  - Minimal function. We discussed the option of nephrectomy both for pain relief as well as eliminating the  nidus of chronic infection. Discussed the risk of bleeding, damage to our structures, hernias, DVT, pneumonia, ileus, et Karie Soda. Reviewed the intraoperative and post-operative course.  - Went over the risk of progression to end-stage renal disease, and solitary kidney precautions.   - Plan to have surgery in August. If she experiences any infections between now and then she will let us know. May even consider a low dose anti-biotic for preventive measures. -Would also like to get a preoperative CT abdomen a few weeks before surgery to ensure there is no fragments or complications from #1 as well as assess the degree of inflammation of her right kidney for surgical planning purposes   [facility] [address] [place], [place] [address] [phone]  I spent 55 total minutes on the day of the encounter including pre-visit review of the medical record, face-to-face time with the patient, and post visit ordering of labs/imaging/tests.  The majority of time was spent face-to-face in the room she and her mother discussing  these 2 surgeries.

## 2023-05-11 ENCOUNTER — Other Ambulatory Visit: Payer: Self-pay

## 2023-05-11 DIAGNOSIS — N201 Calculus of ureter: Secondary | ICD-10-CM

## 2023-05-11 NOTE — Progress Notes (Signed)
ES[facility] ORDER FORM  Expected date of procedure: 05/18/2023  Surgeon: Irineo Axon, MD  Post op standing: 2-4wk follow up w/KUB prior  Anticoagulation/Aspirin/NSAID standing order: Hold all 72 hours prior  Anesthesia standing order: MAC  VTE standing: SCD's  Dx: Left Ureteral Stone  Procedure: left Extracorporeal shock wave lithotripsy  CPT : 50590  Standing Order Set:   *NPO after mn, KUB  *NS 144ml/hr, Keflex 500mg  PO, Benadryl 25mg  PO, Valium 10mg  PO, Zofran 4mg  IV    Medications if other than standing orders:   NONE

## 2023-05-12 ENCOUNTER — Telehealth: Payer: Self-pay

## 2023-05-12 ENCOUNTER — Other Ambulatory Visit: Payer: Self-pay | Admitting: Urology

## 2023-05-12 DIAGNOSIS — N12 Tubulo-interstitial nephritis, not specified as acute or chronic: Secondary | ICD-10-CM

## 2023-05-12 DIAGNOSIS — N261 Atrophy of kidney (terminal): Secondary | ICD-10-CM

## 2023-05-12 NOTE — Telephone Encounter (Signed)
I spoke with Melanie Blankenship and her mother while in clinic this week. We have discussed possible surgery dates and Monday August 26th, 2024 was agreed upon by all parties. Patient given information about surgery date, what to expect pre-operatively and post operatively.  We discussed that a Pre-Admission Testing office will be calling to set up the pre-op visit that will take place prior to surgery, and that these appointments are typically done over the phone with a Pre-Admissions RN. Informed patient that our office will communicate any additional care to be provided after surgery. Patients questions or concerns were discussed during our call. Advised to call our office should there be any additional information, questions or concerns that arise. Patient verbalized understanding.

## 2023-05-12 NOTE — Addendum Note (Signed)
Addended by: Letta Kocher A on: 05/12/2023 02:37 PM   Modules accepted: Orders

## 2023-05-12 NOTE — Progress Notes (Signed)
   [facility] Surgical Posting Form  Surgery Date: Date: 07/24/2023  Surgeon: Dr. Vanna Scotland, MD and Dr. Legrand Rams, MD  Inpt ( Yes  )   Outpt (No)   Obs ( No  )   Diagnosis: N26.1 Right Renal Atrophy, N12 Right Pyelonephritis  -CPT: 8283644904  Surgery: Right Laparoscopic Hand Assisted Radical Nephrectomy   Stop Anticoagulations: Yes and also hold ASA  Cardiac/Medical/Pulmonary Clearance needed: no  *Orders entered into EPIC  Date: 05/12/23   *Case booked in EPIC  Date: 05/12/23  *Notified pt of Surgery: Date: 05/12/23  PRE-OP UA & CX: Yes, will also need to obtain UA and Cx, CBC, BMP, INR, Type and Screen  *Placed into Prior Authorization Work [place] Date: 05/12/23  Assistant/laser/rep:No

## 2023-05-12 NOTE — Progress Notes (Signed)
Surgical Physician Order [facility] [place]  * Scheduling expectation :  Late Aug 2024  *Length of Case:   *Clearance needed: no  *Anticoagulation Instructions: Hold all anticoagulants  *Aspirin Instructions: Hold Aspirin  *Post-op visit Date/Instructions:  1 month follow up  *Diagnosis:  Right pyelonephritis/right renal atrophy  *Procedure: right Laparoscopic radical nephrectomy (robot or hand assist)(50545)   Additional orders: N/A  -Admit type: INpatient  -Anesthesia: General  -VTE Prophylaxis Standing Order SCD's       Other:   -Standing Lab Orders Per Anesthesia    Lab other:  UA/urine culture, CBC, BMP, INR, type and screen  -Standing Test orders EKG/Chest x-ray per Anesthesia       Test other:   - Medications:  Ancef 2gm IV  -Other orders: Pre-op CT of the abdomen with and without contrast about 2 weeks before surgery (please let patient know this recommendation, 1 to assess for any residual stone burden on the contralateral side as well as assess the degree of inflammation of her right kidney for surgical planning)

## 2023-05-12 NOTE — Progress Notes (Signed)
[facility] ORDER FORM  Expected date of procedure: 05/19/23  Surgeon: assigned  Post op standing: 2-4wk follow up w/KUB prior  Anticoagulation/Aspirin/NSAID standing order: Hold all 72 hours prior  Anesthesia standing order: MAC  VTE standing: SCD's  Dx: Left Nephrolithiasis  Procedure: left Extracorporeal shock wave lithotripsy  CPT : 50590  Standing Order Set:   *NPO after mn, KUB  *NS 175ml/hr, Keflex 500mg  PO, Benadryl 25mg  PO, Valium 10mg  PO, Zofran 4mg  IV    Medications if other than standing orders:   NONE  I would like this patient to go ahead and get her KUB today or early next week to ensure that we will better be able to see her stone.  She will not need a repeat KUB on the day of the procedure if the stone is visible.

## 2023-05-14 LAB — CULTURE, URINE COMPREHENSIVE

## 2023-05-15 ENCOUNTER — Ambulatory Visit
Admission: RE | Admit: 2023-05-15 | Discharge: 2023-05-15 | Disposition: A | Discharge status: Home / Self Care | Payer: BC Managed Care – PPO | Source: Ambulatory Visit | Attending: Urology | Admitting: Urology

## 2023-05-15 ENCOUNTER — Ambulatory Visit
Admission: RE | Admit: 2023-05-15 | Discharge: 2023-05-15 | Disposition: A | Discharge status: Home / Self Care | Payer: BC Managed Care – PPO | Attending: Urology | Admitting: Urology

## 2023-05-15 DIAGNOSIS — K5900 Constipation, unspecified: Secondary | ICD-10-CM | POA: Diagnosis not present

## 2023-05-15 DIAGNOSIS — N201 Calculus of ureter: Secondary | ICD-10-CM | POA: Diagnosis not present

## 2023-05-15 DIAGNOSIS — R109 Unspecified abdominal pain: Secondary | ICD-10-CM | POA: Diagnosis not present

## 2023-05-17 MED ORDER — ONDANSETRON HCL 4 MG/2ML IJ SOLN
4.0000 mg | Freq: Once | INTRAMUSCULAR | Status: AC
  Administered 2023-05-18: 4 mg via INTRAVENOUS

## 2023-05-17 MED ORDER — SODIUM CHLORIDE 0.9 % IV SOLN: INTRAVENOUS | Status: DC

## 2023-05-17 MED ORDER — DIPHENHYDRAMINE HCL 25 MG PO CAPS
25.0000 mg | ORAL_CAPSULE | ORAL | Status: AC
  Administered 2023-05-18: 25 mg via ORAL

## 2023-05-17 MED ORDER — DIAZEPAM 5 MG PO TABS
10.0000 mg | ORAL_TABLET | ORAL | Status: AC
  Administered 2023-05-18: 10 mg via ORAL

## 2023-05-17 MED ORDER — CEPHALEXIN 500 MG PO CAPS
500.0000 mg | ORAL_CAPSULE | Freq: Once | ORAL | Status: AC
  Administered 2023-05-18: 500 mg via ORAL

## 2023-05-17 NOTE — Progress Notes (Signed)
Per Dr. Apolinar Junes. Stone is still visible on KUB from 05/15/23. No need for KUB on day of lithotripsy 05/18/23.

## 2023-05-18 ENCOUNTER — Other Ambulatory Visit: Payer: Self-pay

## 2023-05-18 ENCOUNTER — Ambulatory Visit
Admission: RE | Admit: 2023-05-18 | Discharge: 2023-05-18 | Disposition: A | Discharge status: Home / Self Care | Payer: BC Managed Care – PPO | Attending: Urology | Admitting: Urology

## 2023-05-18 ENCOUNTER — Encounter: Payer: Self-pay | Admitting: Urology

## 2023-05-18 ENCOUNTER — Encounter
Admission: RE | Disposition: A | Discharge status: Home / Self Care | Payer: Self-pay | Source: Home / Self Care | Attending: Urology

## 2023-05-18 DIAGNOSIS — I10 Essential (primary) hypertension: Secondary | ICD-10-CM | POA: Insufficient documentation

## 2023-05-18 DIAGNOSIS — N201 Calculus of ureter: Secondary | ICD-10-CM

## 2023-05-18 DIAGNOSIS — N200 Calculus of kidney: Secondary | ICD-10-CM | POA: Insufficient documentation

## 2023-05-18 DIAGNOSIS — Z01818 Encounter for other preprocedural examination: Secondary | ICD-10-CM

## 2023-05-18 DIAGNOSIS — N2881 Hypertrophy of kidney: Secondary | ICD-10-CM | POA: Insufficient documentation

## 2023-05-18 DIAGNOSIS — Z87442 Personal history of urinary calculi: Secondary | ICD-10-CM | POA: Insufficient documentation

## 2023-05-18 HISTORY — PX: EXTRACORPOREAL SHOCK WAVE LITHOTRIPSY: SHX1557

## 2023-05-18 LAB — POCT PREGNANCY, URINE: Preg Test, Ur: NEGATIVE

## 2023-05-18 LAB — POCT URINE PREGNANCY: Preg Test, Ur: NEGATIVE

## 2023-05-18 SURGERY — LITHOTRIPSY, ESWL
Anesthesia: Moderate Sedation | Laterality: Left

## 2023-05-18 MED ORDER — DIPHENHYDRAMINE HCL 25 MG PO CAPS
ORAL_CAPSULE | ORAL | Status: AC
  Filled 2023-05-18: qty 1

## 2023-05-18 MED ORDER — ONDANSETRON HCL 4 MG/2ML IJ SOLN
INTRAMUSCULAR | Status: AC
  Filled 2023-05-18: qty 2

## 2023-05-18 MED ORDER — HYDROCODONE-ACETAMINOPHEN 5-325 MG PO TABS: 1.0000 | ORAL_TABLET | Freq: Four times a day (QID) | ORAL | 0 refills | Status: DC | PRN

## 2023-05-18 MED ORDER — DIAZEPAM 5 MG PO TABS
ORAL_TABLET | ORAL | Status: AC
  Filled 2023-05-18: qty 2

## 2023-05-18 MED ORDER — CEPHALEXIN 500 MG PO CAPS
ORAL_CAPSULE | ORAL | Status: AC
  Filled 2023-05-18: qty 1

## 2023-05-18 NOTE — Discharge Instructions (Addendum)
As per the [facility] discharge instructions A prescription for pain medication was sent to your pharmacy Call [facility] at [phone] for pain not controlled with oral medications or fever greater than 101 degrees You will be contacted for a follow-up appointment  AMBULATORY SURGERY  DISCHARGE INSTRUCTIONS   The drugs that you were given will stay in your system until tomorrow so for the next 24 hours you should not:  Drive an automobile Make any legal decisions Drink any alcoholic beverage   You may resume regular meals tomorrow.  Today it is better to start with liquids and gradually work up to solid foods.  You may eat anything you prefer, but it is better to start with liquids, then soup and crackers, and gradually work up to solid foods.   Please notify your doctor immediately if you have any unusual bleeding, trouble breathing, redness and pain at the surgery site, drainage, fever, or pain not relieved by medication.    lease contact your physician with any problems or Same Day Surgery at [phone], Monday through Friday 6 am to 4 pm, or [place] [facility] number at [phone].

## 2023-05-18 NOTE — Interval H&P Note (Signed)
History and Physical Interval Note:  CV:RRR Lungs:clear  05/18/2023 8:27 AM  Melanie Blankenship  has presented today for surgery, with the diagnosis of Left Ureteral Stone.  The various methods of treatment have been discussed with the patient and family. After consideration of risks, benefits and other options for treatment, the patient has consented to  Procedure(s): EXTRACORPOREAL SHOCK WAVE LITHOTRIPSY ([facility]) (Left) as a surgical intervention.  The patient's history has been reviewed, patient examined, no change in status, stable for surgery.  I have reviewed the patient's chart and labs.  Questions were answered to the patient's satisfaction.     [name] C Sukaina Toothaker

## 2023-05-19 ENCOUNTER — Encounter: Payer: Self-pay | Admitting: Urology

## 2023-06-06 ENCOUNTER — Encounter: Payer: Self-pay | Admitting: Physician Assistant

## 2023-06-06 ENCOUNTER — Ambulatory Visit: Payer: BC Managed Care – PPO | Admitting: Physician Assistant

## 2023-06-06 ENCOUNTER — Ambulatory Visit
Admission: RE | Admit: 2023-06-06 | Discharge: 2023-06-06 | Disposition: A | Discharge status: Home / Self Care | Payer: BC Managed Care – PPO | Source: Ambulatory Visit | Attending: Urology | Admitting: Urology

## 2023-06-06 ENCOUNTER — Ambulatory Visit
Admission: RE | Admit: 2023-06-06 | Discharge: 2023-06-06 | Disposition: A | Discharge status: Home / Self Care | Payer: BC Managed Care – PPO | Attending: Urology | Admitting: Urology

## 2023-06-06 VITALS — BP 136/93 | HR 96 | Wt 174.0 lb

## 2023-06-06 DIAGNOSIS — N200 Calculus of kidney: Secondary | ICD-10-CM

## 2023-06-06 DIAGNOSIS — N201 Calculus of ureter: Secondary | ICD-10-CM | POA: Insufficient documentation

## 2023-06-06 LAB — URINALYSIS, COMPLETE
Bilirubin, UA: NEGATIVE
Glucose, UA: NEGATIVE
Ketones, UA: NEGATIVE
Leukocytes,UA: NEGATIVE
Nitrite, UA: NEGATIVE
Protein,UA: NEGATIVE
Specific Gravity, UA: 1.01 (ref 1.005–1.030)
Urobilinogen, Ur: 0.2 mg/dL (ref 0.2–1.0)
pH, UA: 5.5 (ref 5.0–7.5)

## 2023-06-06 LAB — MICROSCOPIC EXAMINATION

## 2023-06-06 IMAGING — CR DG CHEST 2V
2 series · 2 of 2 positions shown · non-contrast
Comparison: April 28, 2005.

CLINICAL DATA: Fever.

EXAM:
CHEST - 2 VIEW

[chest pa]
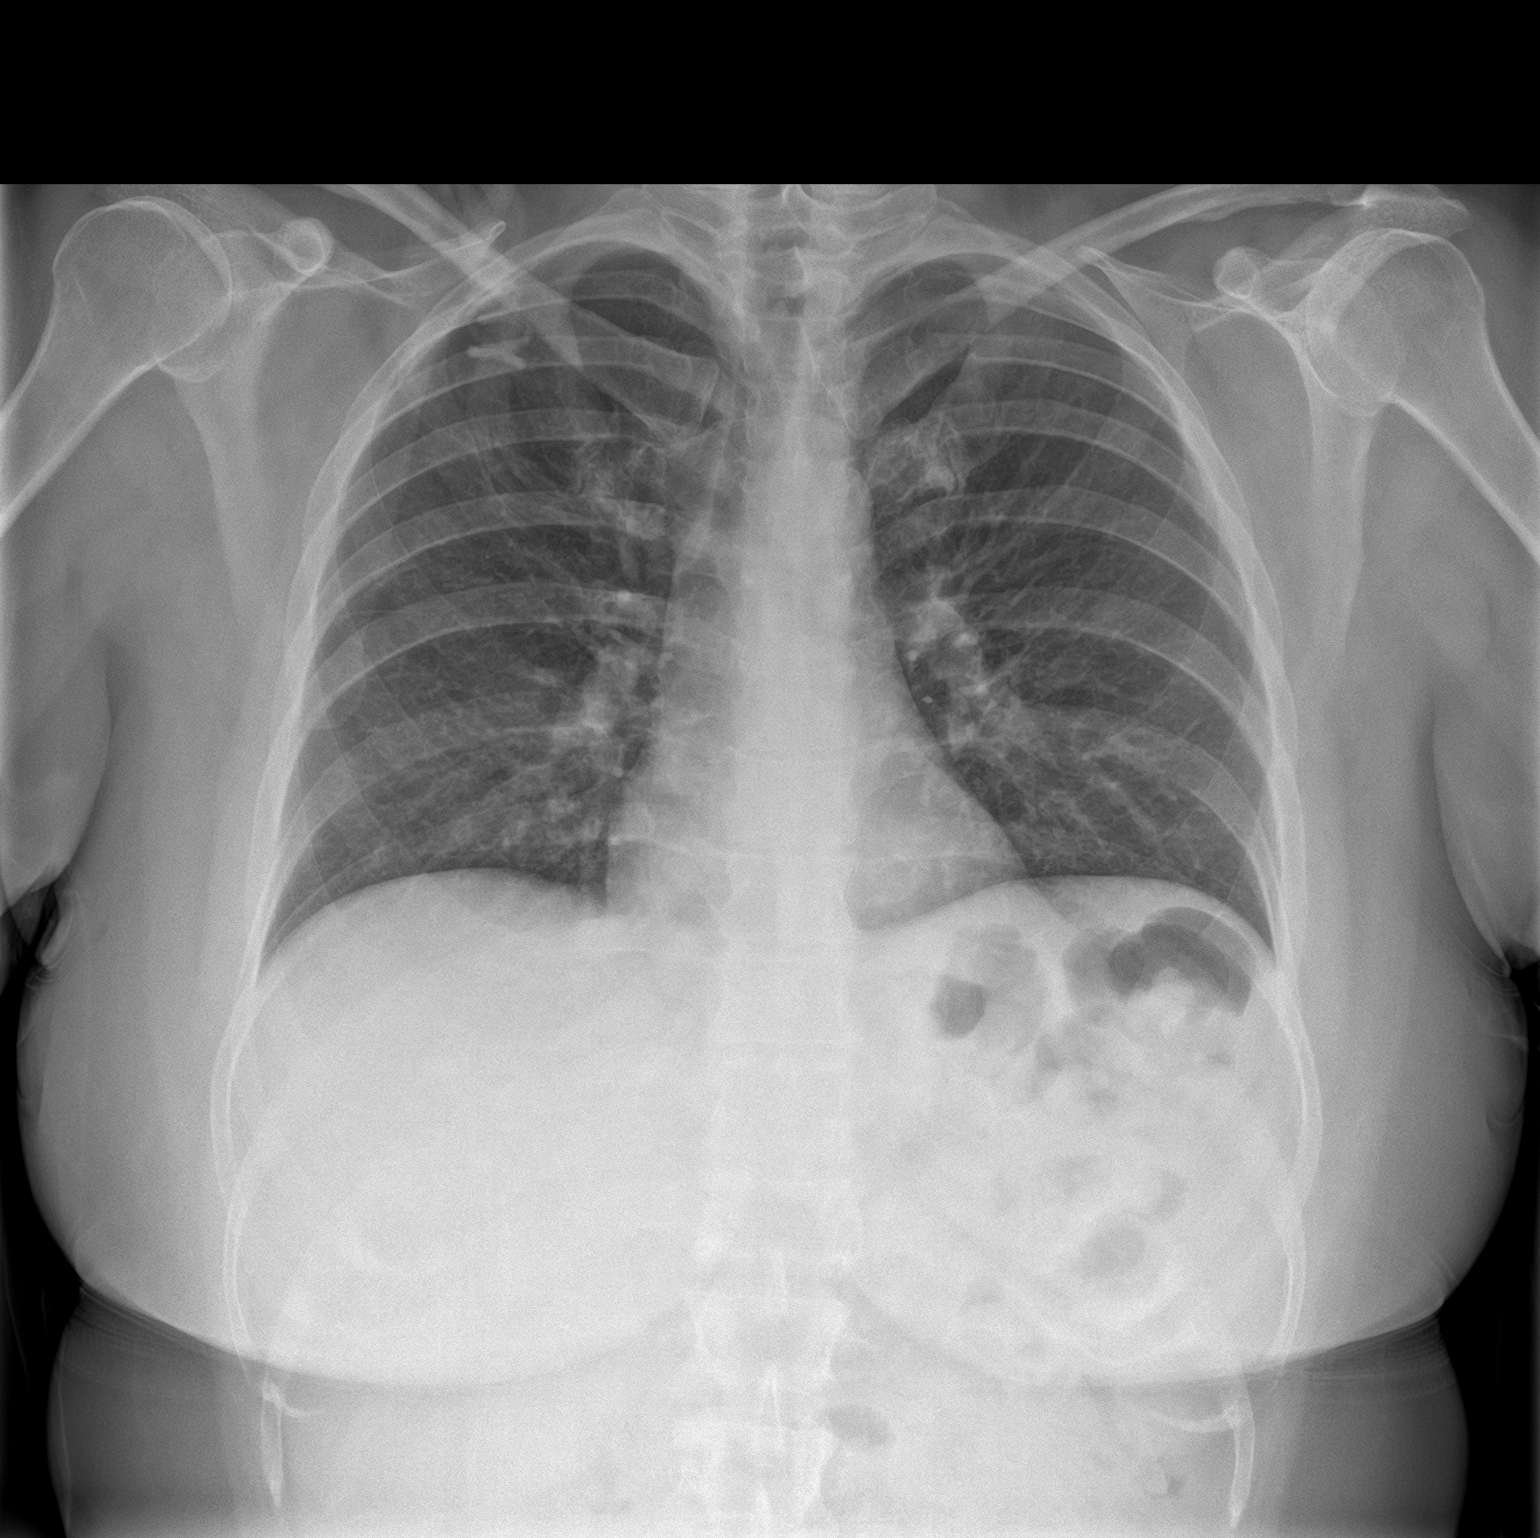

[chest lat]
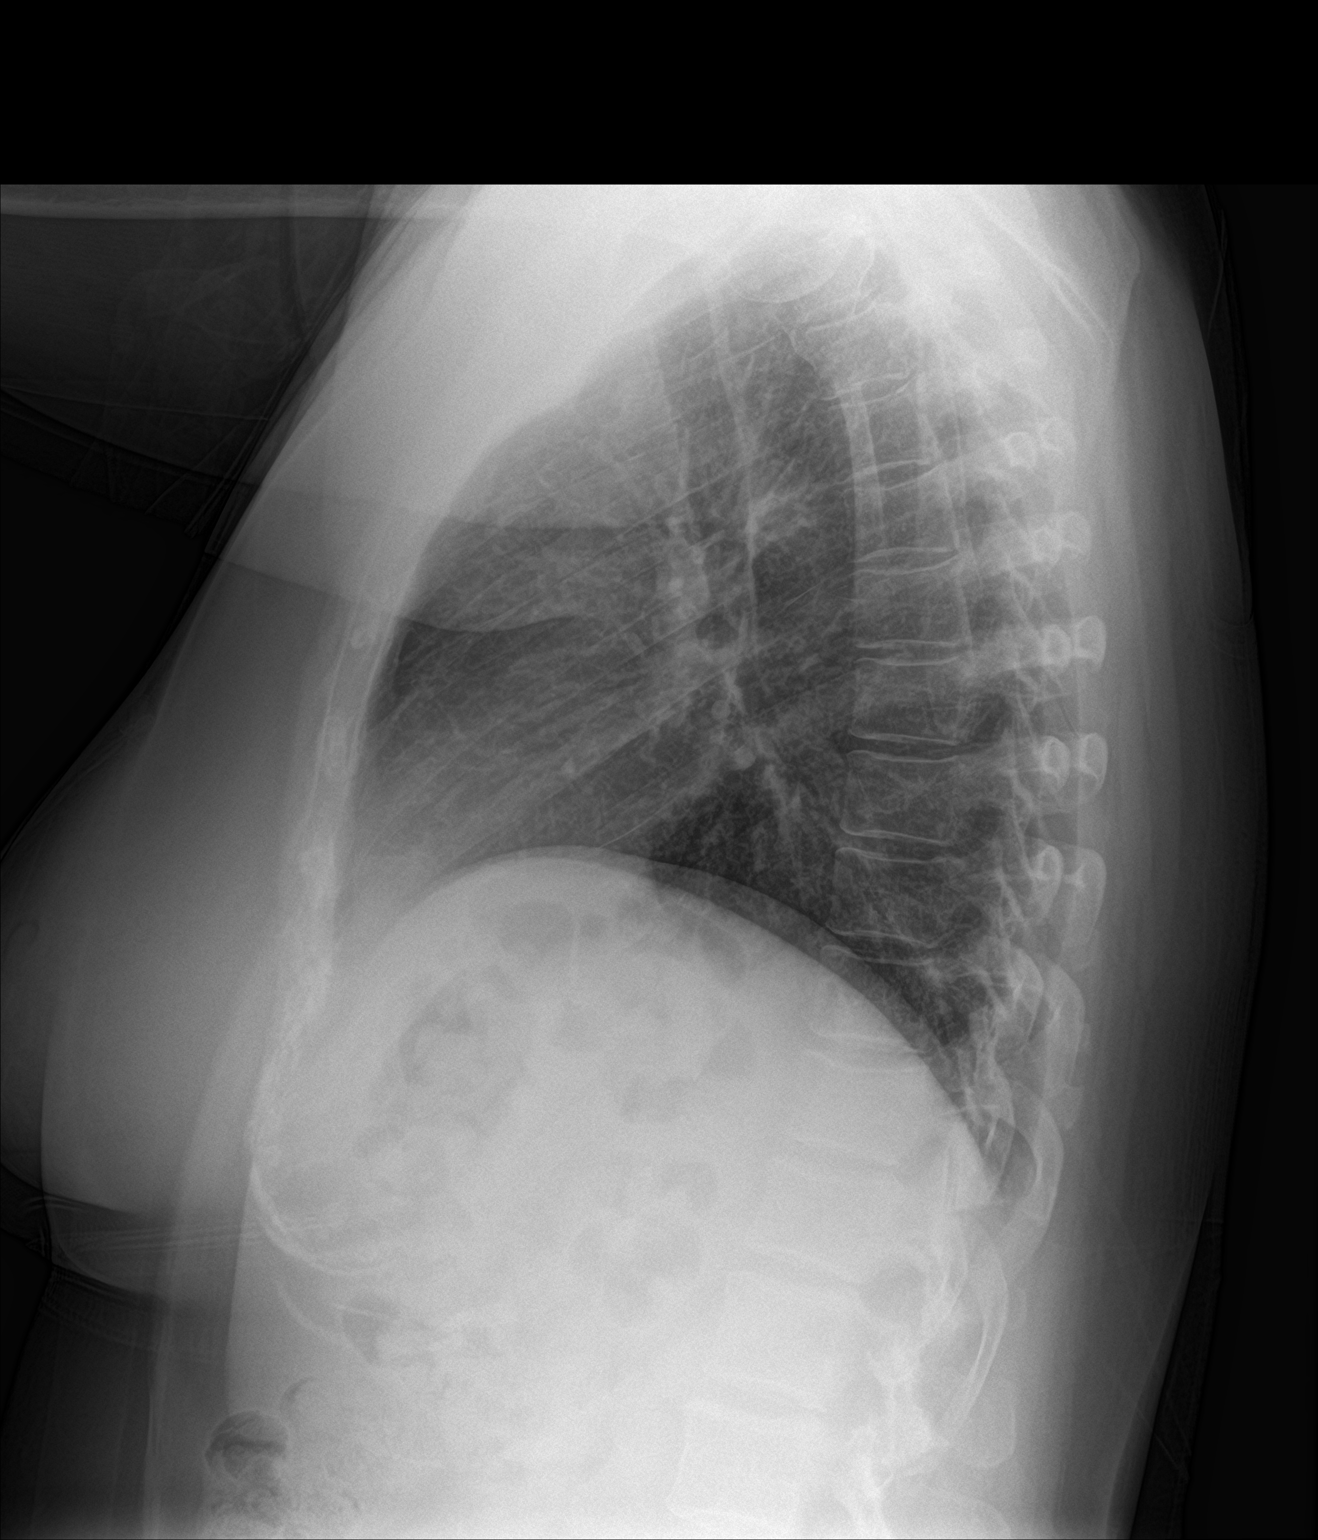

[2 of 2 positions shown; findings below may reference images not displayed]

FINDINGS: No consolidation. No visible pleural effusions or pneumothorax.
Cardiomediastinal silhouette is within normal limits. Linear opacity
projecting over the right upper chest is favored external to the
patient. No displaced fracture.
IMPRESSION: No evidence of acute cardiopulmonary disease.

## 2023-06-06 IMAGING — CT CT HEAD W/O CM
4 series · 17 of 47 positions shown, 19 images · non-contrast
Comparison: None.

CLINICAL DATA: Headache



[Series 2: head wo · axial · 0.39mm/px · z∈[-141,-31]mm · 7 of 30 slices shown, 9 images]
[im 4/30  brain]
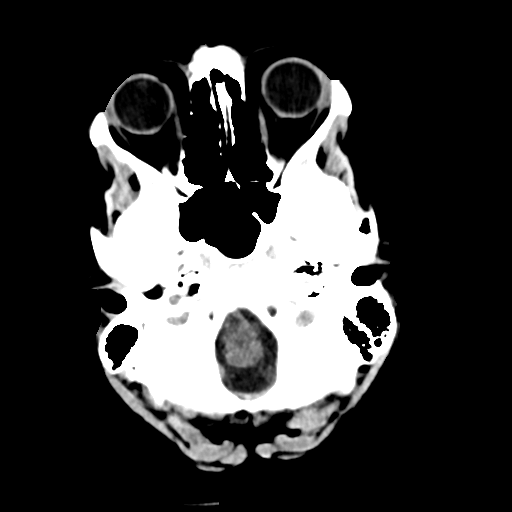
[im 4/30  bone]
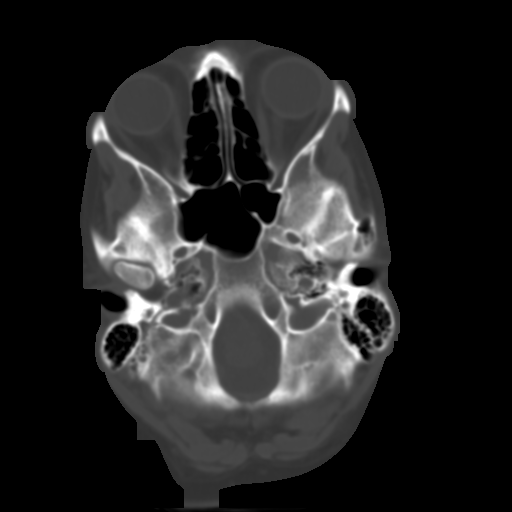
[im 8/30  brain]
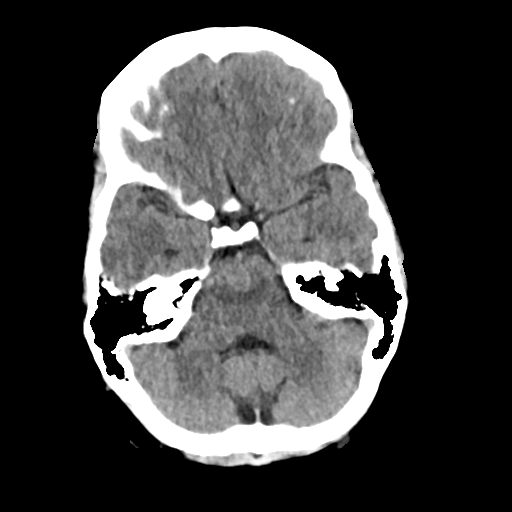
[im 11/30  brain]
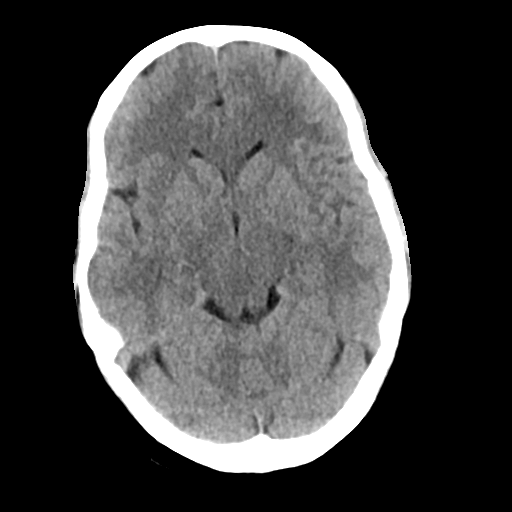
[im 15/30  brain]
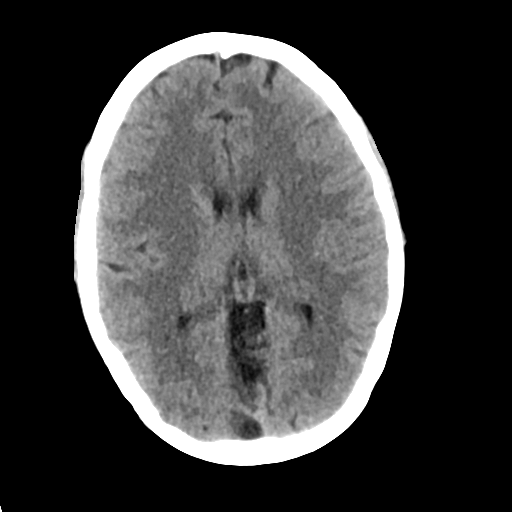
[im 19/30  brain]
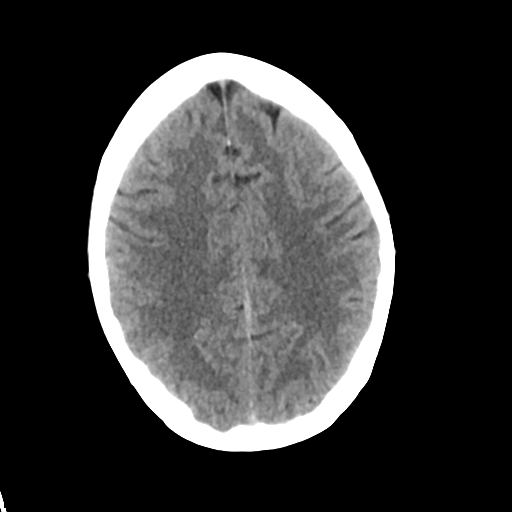
[im 19/30  bone]
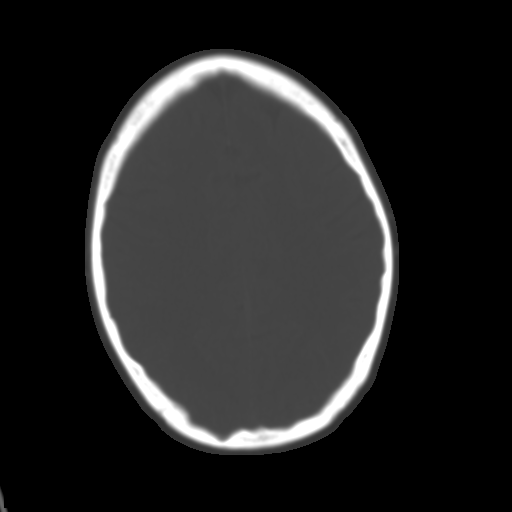
[im 22/30  brain]
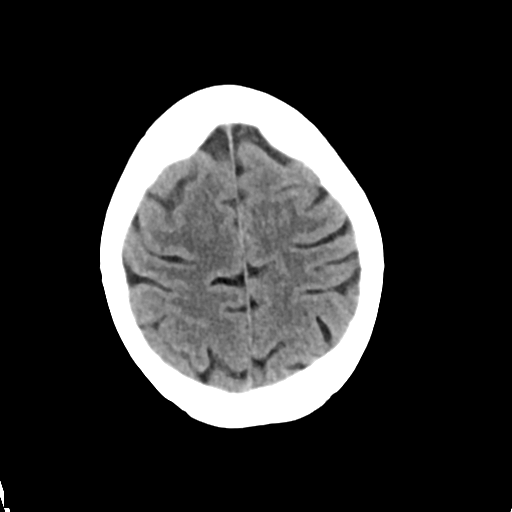
[im 26/30  brain]
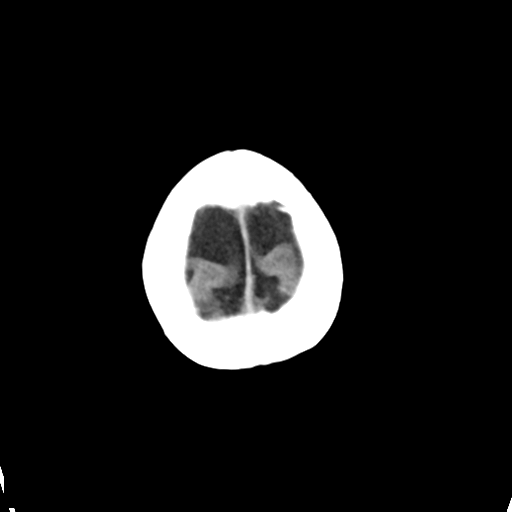

[Series 3: head bone · axial · 0.39mm/px · z∈[-142,-92]mm · 4 of 74 slices shown]
[im 8/74  bone]
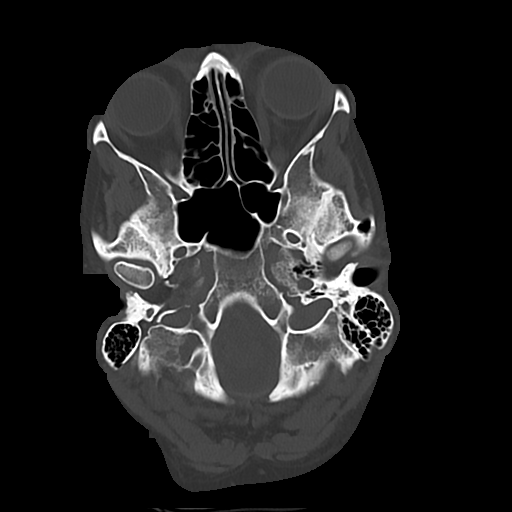
[im 15/74  bone]
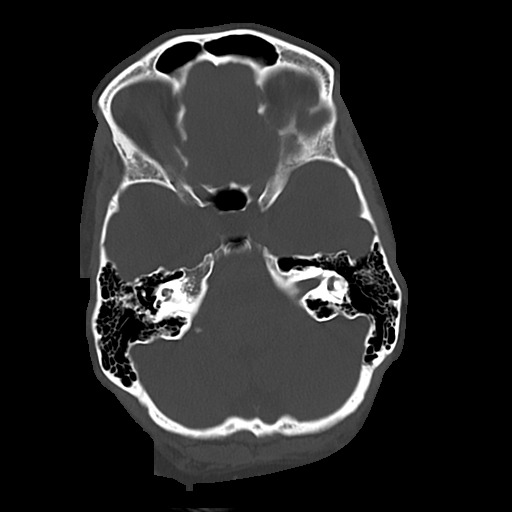
[im 22/74  bone]
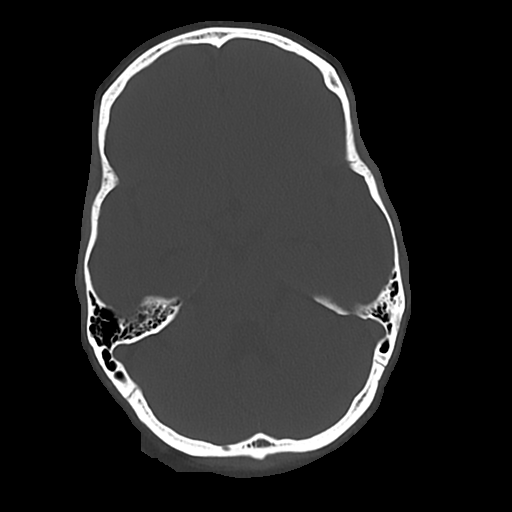
[im 33/74  bone]
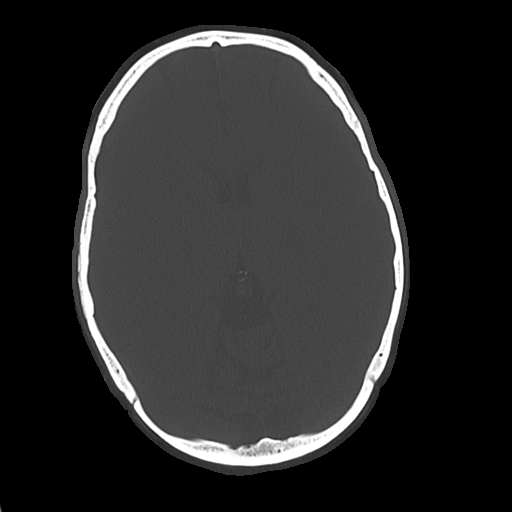

[Series 4: cor soft · coronal · 0.31mm/px · 3 of 64 slices shown]
[im 22/64  brain]
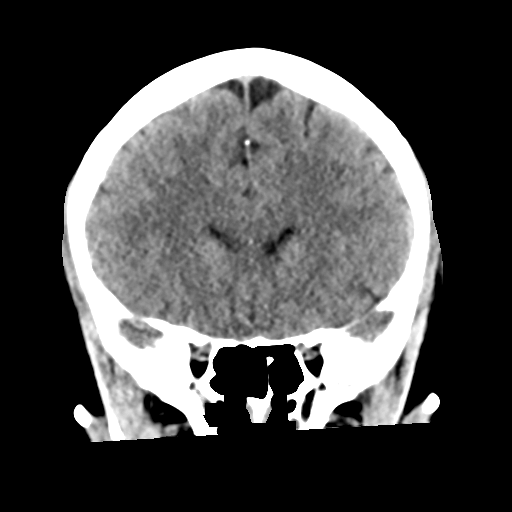
[im 29/64  brain]
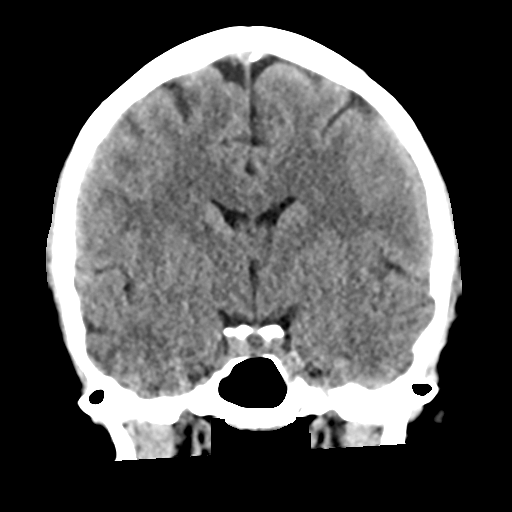
[im 36/64  brain]
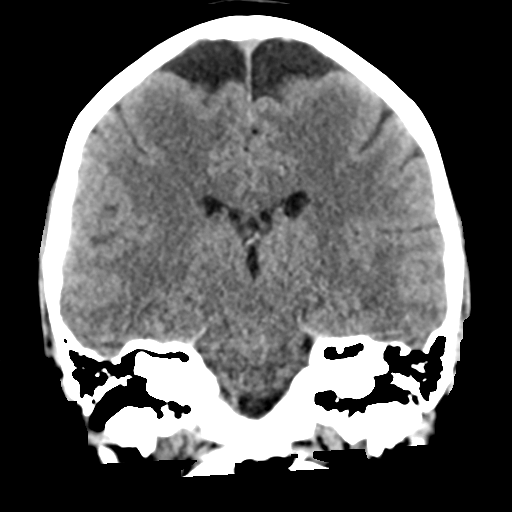

[Series 5: sag soft · sagittal · 0.31mm/px · 3 of 53 slices shown]
[im 18/53  brain]
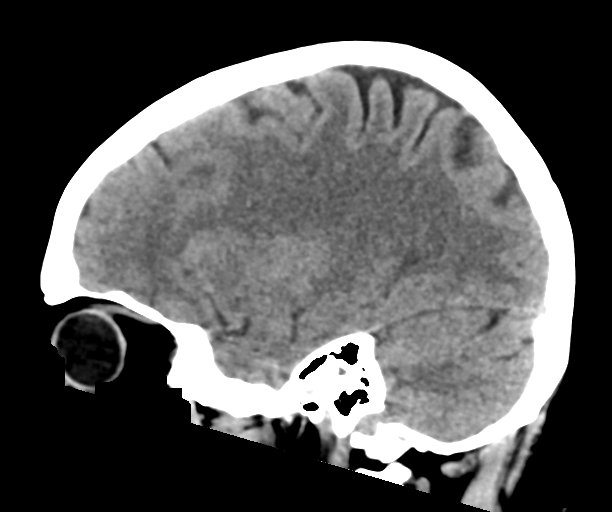
[im 27/53  brain]
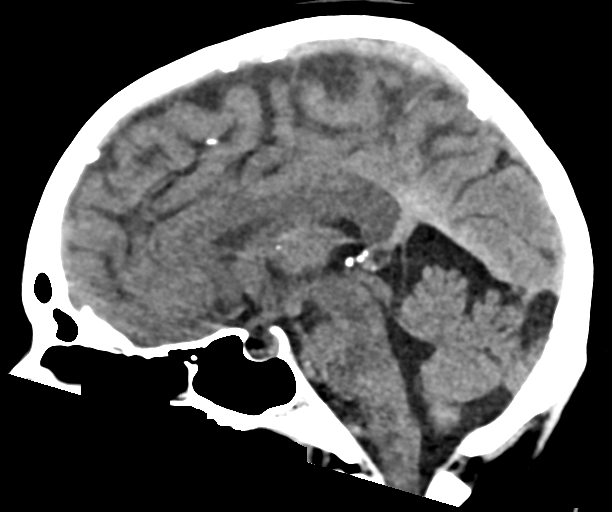
[im 35/53  brain]
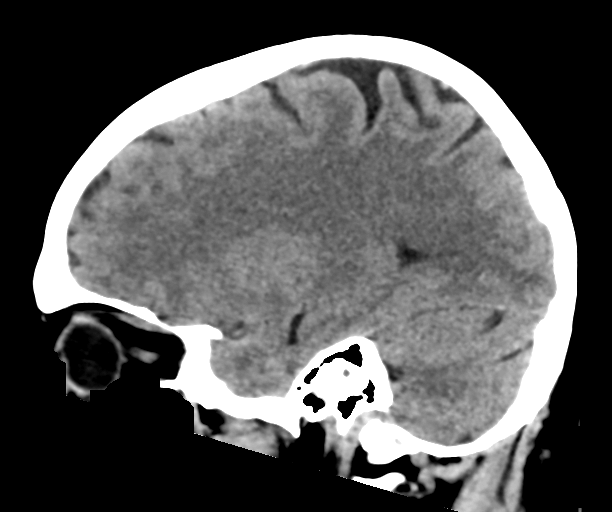

[17 of 47 positions shown; findings below may reference images not displayed]

FINDINGS: Brain: No acute intracranial hemorrhage, mass effect, or herniation.
No extra-axial fluid collections. No evidence of acute territorial
infarct. No hydrocephalus.

Vascular: No hyperdense vessel or unexpected calcification.

Skull: Normal. Negative for fracture or focal lesion.

Sinuses/Orbits: No acute finding.

Other: None.
IMPRESSION: No acute intracranial process identified.

## 2023-06-06 IMAGING — CT CT ABD-PELV W/ CM
3 of 5 series · 10 of 46 positions shown, 15 images · IV contrast (APPLIED)
Comparison: July 16, 2021.

CLINICAL DATA: RIGHT lower quadrant abdominal pain in a 40-year-old
female.

EXAM:
CT ABDOMEN AND PELVIS WITH CONTRAST
TECHNIQUE: Multidetector CT imaging of the abdomen and pelvis was performed
using the standard protocol following bolus administration of
intravenous contrast.

[Series 3: abdomen 5.0 · axial · 0.76mm/px · z∈[-824,-459]mm · 6 of 103 slices shown, 11 images]
[im 15/103  soft-tissue]
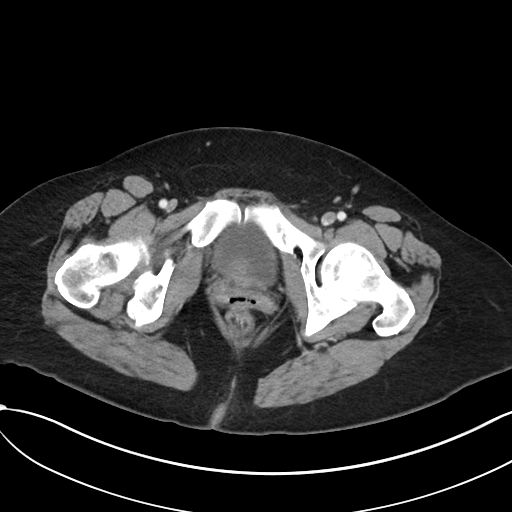
[im 15/103  bone]
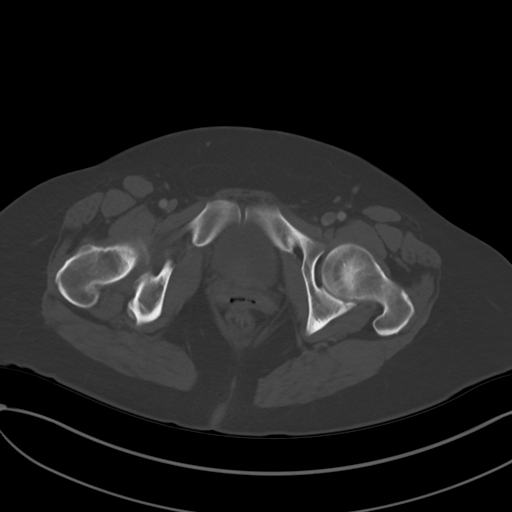
[im 30/103  soft-tissue]
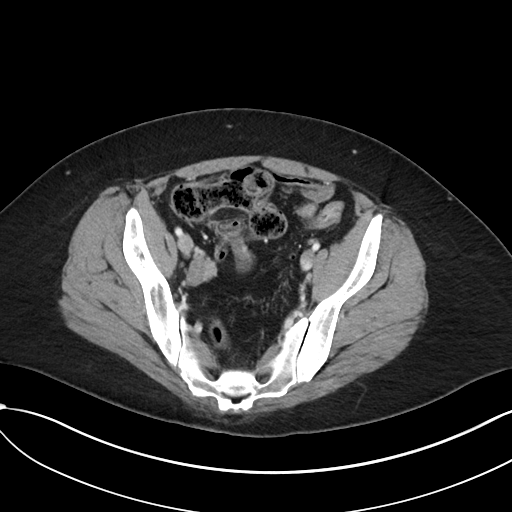
[im 44/103  soft-tissue]
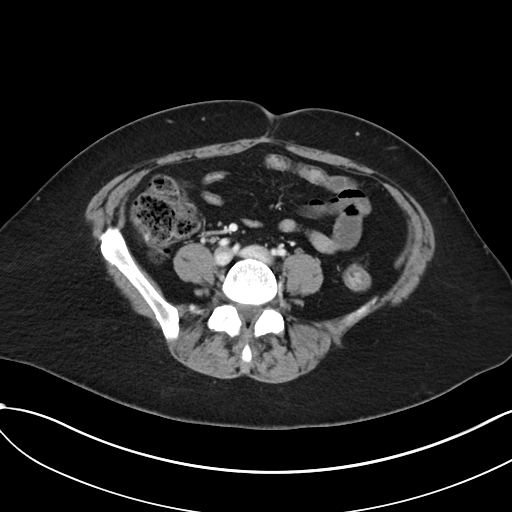
[im 44/103  lung]
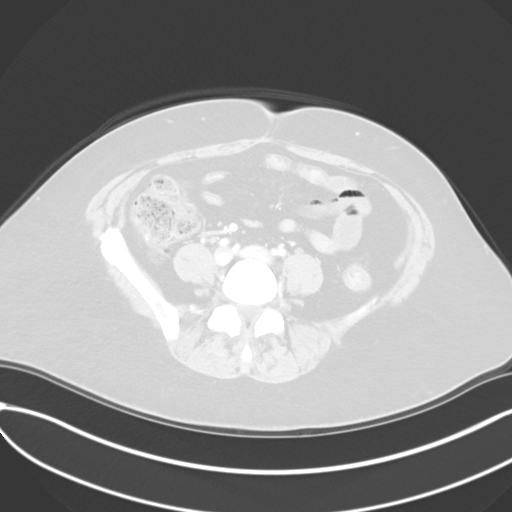
[im 59/103  soft-tissue]
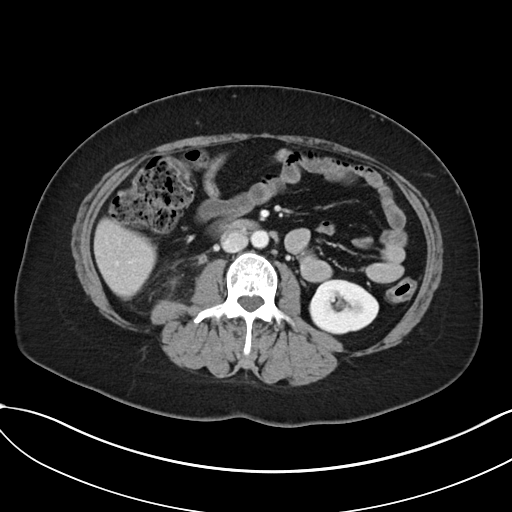
[im 59/103  lung]
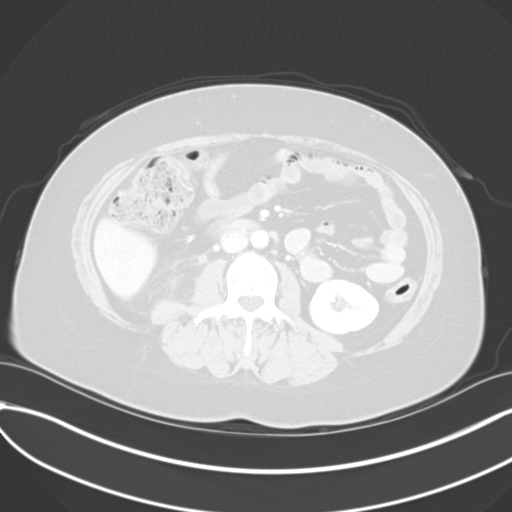
[im 73/103  soft-tissue]
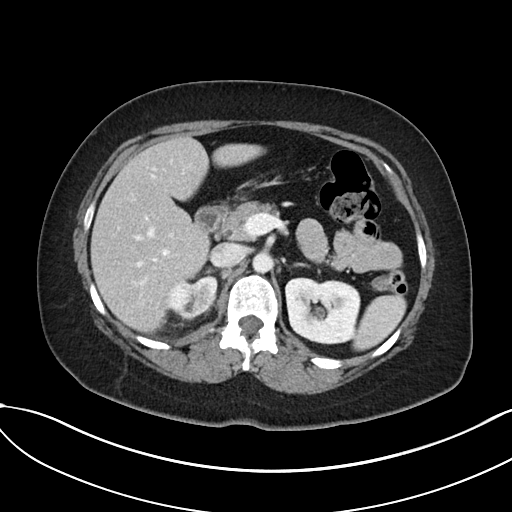
[im 73/103  lung]
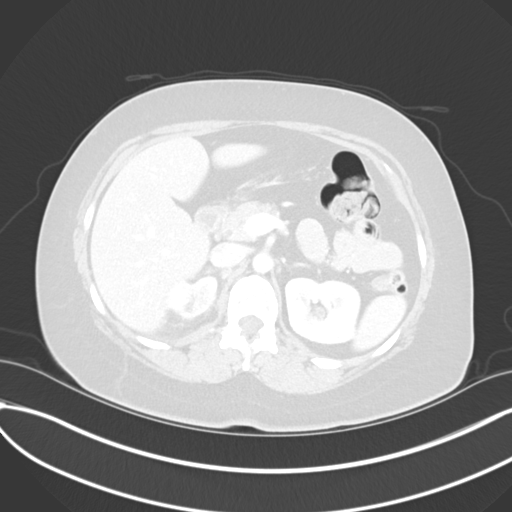
[im 88/103  soft-tissue]
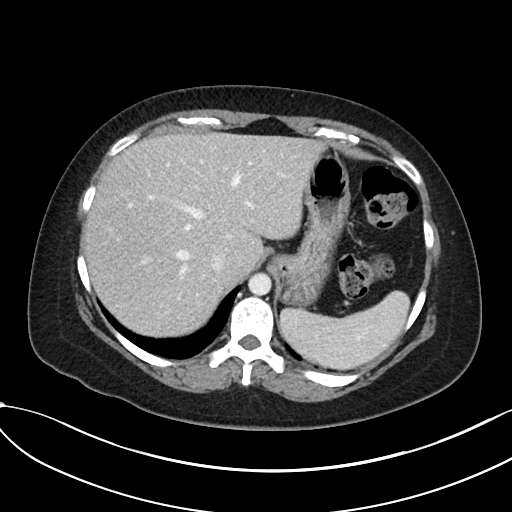
[im 88/103  lung]
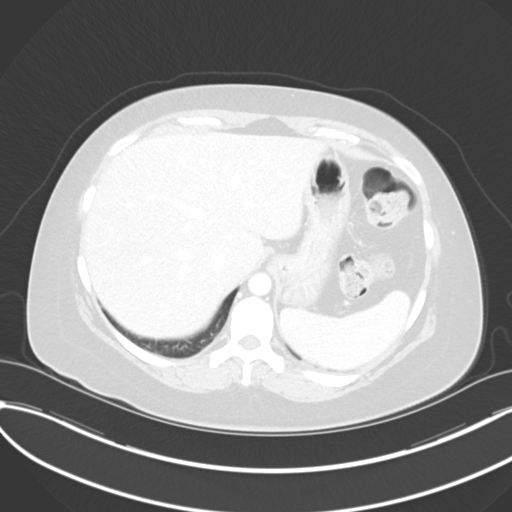

[Series 6: abdomen 3.0 mpr cor · coronal · 0.89mm/px · 3 of 92 slices shown]
[im 31/92  soft-tissue]
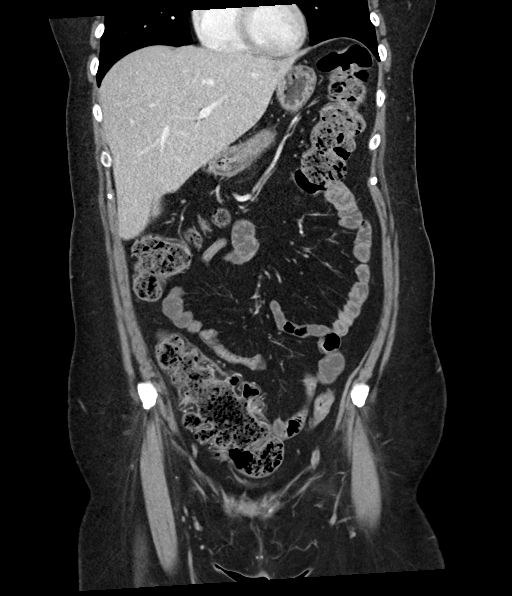
[im 41/92  soft-tissue]
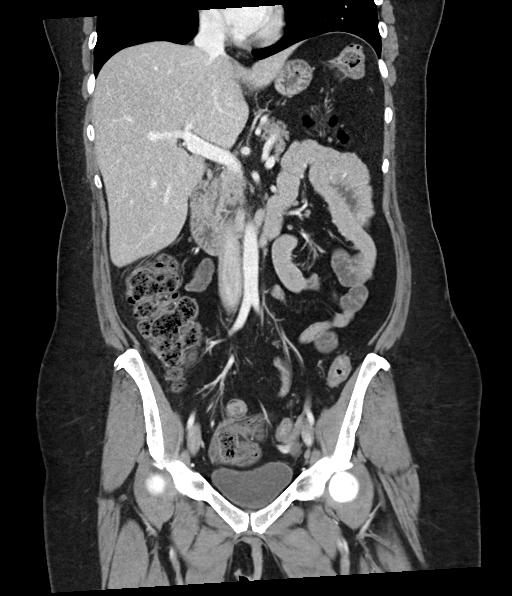
[im 51/92  soft-tissue]
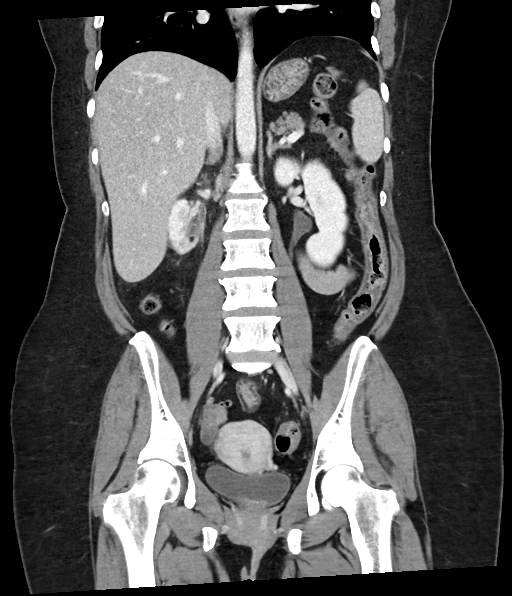

[Series 7: abdomen 3.0 mpr sag · sagittal · 0.54mm/px · 1 of 153 slices shown]
[im 51/153  soft-tissue]
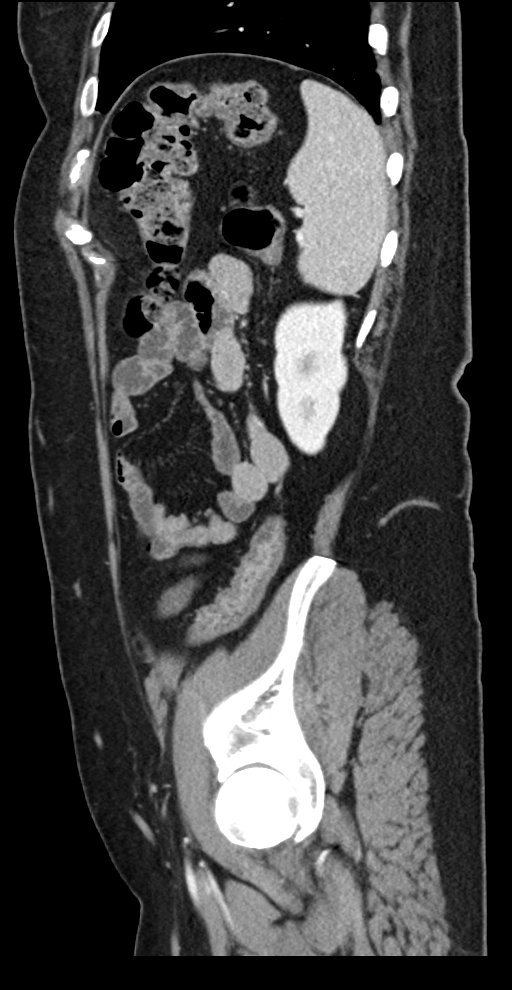

[10 of 46 positions shown; findings below may reference images not displayed]

RADIATION DOSE REDUCTION: This exam was performed according to the
departmental dose-optimization program which includes automated
exposure control, adjustment of the mA and/or kV according to
patient size and/or use of iterative reconstruction technique.

CONTRAST:  100mL OMNIPAQUE IOHEXOL 300 MG/ML  SOLN
FINDINGS: Lower chest: Unremarkable.

Hepatobiliary: Smooth hepatic contours. Moderate hepatic steatosis.
Mild hepatomegaly. Portal vein is patent. No biliary duct
distension. No focal, suspicious hepatic lesion.

Pancreas: Normal, without mass, inflammation or ductal dilatation.

Spleen: Normal.

Adrenals/Urinary Tract: Adrenal glands are normal.

Marked RIGHT renal cortical scarring. Diffuse RIGHT urothelial
enhancement. Mild fullness of the proximal RIGHT ureter. No signs of
hydronephrosis. Patchy nephrogram on the RIGHT with delayed
excretion. Renal cortical scarring is similar to prior imaging.

Nephrolithiasis on the LEFT is similar to the prior exam. There is
no substantial urothelial enhancement on the LEFT. Urinary bladder
is decompressed. No perivesical stranding.

Stomach/Bowel: Normal appendix.

Mild mural stratification of the terminal ileum. Some suggestion of
this on prior imaging. No signs of stranding adjacent to small bowel
loops. No stranding adjacent to the colon. Moderate stool in the
colon. Mild mural stratification of descending colon and no evidence
of pericolonic stranding.

Vascular/Lymphatic: Scattered small lymph nodes in the
retroperitoneum none with pathologic enlargement. Patent abdominal
vasculature. No pelvic adenopathy.

Reproductive: Unremarkable to the extent evaluated on CT.

Other: No ascites.  No pneumoperitoneum.

Musculoskeletal: No acute or significant osseous findings.
IMPRESSION: 1. Findings of RIGHT-sided pyelonephritis with diffuse urothelial
enhancement and striated nephrogram, associated with marked RIGHT
renal cortical scarring. Scarring is similar to prior imaging.
Urologic follow-up may be helpful given degree of renal cortical
scarring and evidence of repeated urinary tract infections.
2. Mild ureteral distension and diffuse urothelial enhancement is
likely related to infection or inflammation, this could also be seen
in the setting of recent passage of a urinary tract calculus though
there is no calculus seen on the current exam.
3. Mild mural stratification of the terminal ileum and descending
colon, nonspecific, but can be seen in the setting of prior
inflammation and even inflammatory bowel disease. Similar findings
seen on previous imaging. No definitive signs of acute inflammation
currently. Correlate clinically with GI follow-up as warranted
4. Normal appendix.
5. Moderate hepatic steatosis and hepatomegaly.

## 2023-06-06 NOTE — Progress Notes (Signed)
06/06/2023 9:15 AM   [name] [birth year] [id number]  CC: Chief Complaint  Patient presents with   Nephrolithiasis   HPI: Melanie Blankenship is a 42 y.o. female who underwent ESWL with Dr. Lonna Cobb on 05/18/2023 for management of a 5 mm left renal stone in advance of scheduled right nephrectomy who presents today for postop follow-up.  Operative note describes smudging of the stone.   Today she reports she did not receive a strainer following her ESWL, so she did not strain her urine and never saw any fragments pass.  She remains asymptomatic of her stone.  KUB today with stable left renal stone, possible slight decrease in size over prior.  In-office UA today positive for trace lysed blood; urine microscopy pan negative.  PMH: Past Medical History:  Diagnosis Date   Hypertension    Kidney stones    Migraine     Surgical History: Past Surgical History:  Procedure Laterality Date   EXTRACORPOREAL SHOCK WAVE LITHOTRIPSY Left 05/18/2023   Procedure: EXTRACORPOREAL SHOCK WAVE LITHOTRIPSY (ESWL);  Surgeon: Riki Altes, MD;  Location: [facility] ORS;  Service: Urology;  Laterality: Left;    Home Medications:  Allergies as of 06/06/2023       Reactions   Azithromycin Diarrhea, Hives   Latex Hives        Medication List        Accurate as of June 06, 2023  9:15 AM. If you have any questions, ask your nurse or doctor.          benzonatate 200 MG capsule Commonly known as: TESSALON Take 1 capsule (200 mg total) by mouth 2 (two) times daily as needed for cough.   cyanocobalamin 1000 MCG tablet Take 1,000 mcg by mouth daily.   fluticasone 50 MCG/ACT nasal spray Commonly known as: FLONASE Place 2 sprays into both nostrils daily.   hydrochlorothiazide 25 MG tablet Commonly known as: HYDRODIURIL Take 25 mg by mouth daily.   HYDROcodone-acetaminophen 5-325 MG tablet Commonly known as: NORCO/VICODIN Take 1 tablet by mouth every 6 (six) hours as needed for  moderate pain.   neomycin-polymyxin-hydrocortisone 3.5-10000-1 OTIC suspension Commonly known as: CORTISPORIN Place 3 drops into the right ear 4 (four) times daily.   potassium chloride SA 20 MEQ tablet Commonly known as: KLOR-CON M Take 1 tablet (20 mEq total) by mouth daily for 3 days.   promethazine 25 MG tablet Commonly known as: PHENERGAN Take 25 mg by mouth every 6 (six) hours as needed for nausea/vomiting.   traZODone 100 MG tablet Commonly known as: DESYREL Take 50 mg by mouth at bedtime as needed.        Allergies:  Allergies  Allergen Reactions   Azithromycin Diarrhea and Hives   Latex Hives    Family History: No family history on file.  Social History:   reports that she has never smoked. She has never used smokeless tobacco. She reports that she does not drink alcohol. No history on file for drug use.  Physical Exam: BP (!) 136/93   Pulse 96   Wt 174 lb (78.9 kg)   LMP 05/28/2023   BMI 29.87 kg/m   Constitutional:  Alert and oriented, no acute distress, nontoxic appearing HEENT: [place], AT Cardiovascular: No clubbing, cyanosis, or edema Respiratory: Normal respiratory effort, no increased work of breathing Skin: No rashes, bruises or suspicious lesions Neurologic: Grossly intact, no focal deficits, moving all 4 extremities Psychiatric: Normal mood and affect  Laboratory Data: Results for orders placed  or performed in visit on 06/06/23  Microscopic Examination   Urine  Result Value Ref Range   WBC, UA 0-5 0 - 5 /hpf   RBC, Urine 0-2 0 - 2 /hpf   Epithelial Cells (non renal) 0-10 0 - 10 /hpf   Mucus, UA Present (A) Not Estab.   Bacteria, UA Few None seen/Few  Urinalysis, Complete  Result Value Ref Range   Specific Gravity, UA 1.010 1.005 - 1.030   pH, UA 5.5 5.0 - 7.5   Color, UA Yellow Yellow   Appearance Ur Clear Clear   Leukocytes,UA Negative Negative   Protein,UA Negative Negative/Trace   Glucose, UA Negative Negative   Ketones, UA  Negative Negative   RBC, UA Trace (A) Negative   Bilirubin, UA Negative Negative   Urobilinogen, Ur 0.2 0.2 - 1.0 mg/dL   Nitrite, UA Negative Negative   Microscopic Examination See below:    Pertinent Imaging: KUB, 06/06/2023: CLINICAL DATA:  Left Ureteral Stone   EXAM: ABDOMEN - 1 VIEW   COMPARISON:  05/15/2023   FINDINGS: Stomach and small bowel decompressed. Scattered gas and fecal material in the nondilated colon. No abnormal abdominal calcifications. Regional bones unremarkable.   IMPRESSION: Negative.     Electronically Signed   By: Corlis Leak M.D.   On: 06/10/2023 08:16  I personally reviewed the images referenced above and persistent left lower pole stone, slightly decreased in size compared to prior.  Assessment & Plan:   1. Left renal stone She remains asymptomatic and UA is bland today.  There is persistent stone at the left lower pole, though on personal review it appears slightly diminished in size compared to prior.  After the patient left clinic today, I had a chance to speak with Dr. Apolinar Junes about her given plans for upcoming right nephrectomy.  Will keep plans for preop CT abdomen pelvis with and without contrast to evaluate both her left-sided stone burden and right-sided perinephric inflammation.  Further intervention for stone TBD based on these results.  I contacted the patient via telephone to share this plan and she is in agreement. - Urinalysis, Complete   Return for Will call to discuss stone status.  Carman Ching, PA-C  [facility] [place] [address] [place], [place] [address] [phone]

## 2023-07-05 ENCOUNTER — Encounter: Payer: Self-pay | Admitting: Urology

## 2023-07-07 ENCOUNTER — Ambulatory Visit
Admission: RE | Admit: 2023-07-07 | Discharge: 2023-07-07 | Disposition: A | Discharge status: Home / Self Care | Payer: BC Managed Care – PPO | Source: Ambulatory Visit | Attending: Urology | Admitting: Urology

## 2023-07-07 DIAGNOSIS — Z905 Acquired absence of kidney: Secondary | ICD-10-CM | POA: Diagnosis not present

## 2023-07-07 DIAGNOSIS — N261 Atrophy of kidney (terminal): Secondary | ICD-10-CM | POA: Insufficient documentation

## 2023-07-07 DIAGNOSIS — N2889 Other specified disorders of kidney and ureter: Secondary | ICD-10-CM | POA: Diagnosis not present

## 2023-07-07 DIAGNOSIS — N12 Tubulo-interstitial nephritis, not specified as acute or chronic: Secondary | ICD-10-CM

## 2023-07-07 DIAGNOSIS — N200 Calculus of kidney: Secondary | ICD-10-CM | POA: Diagnosis not present

## 2023-07-07 LAB — POCT I-STAT CREATININE: Creatinine, Ser: 0.9 mg/dL (ref 0.44–1.00)

## 2023-07-07 MED ORDER — IOHEXOL 300 MG/ML  SOLN
100.0000 mL | Freq: Once | INTRAMUSCULAR | Status: AC | PRN
  Administered 2023-07-07: 100 mL via INTRAVENOUS

## 2023-07-13 ENCOUNTER — Encounter
Admission: RE | Admit: 2023-07-13 | Discharge: 2023-07-13 | Disposition: A | Discharge status: Home / Self Care | Payer: BC Managed Care – PPO | Source: Ambulatory Visit | Attending: Urology | Admitting: Urology

## 2023-07-13 ENCOUNTER — Other Ambulatory Visit: Payer: Self-pay

## 2023-07-13 DIAGNOSIS — Z01812 Encounter for preprocedural laboratory examination: Secondary | ICD-10-CM

## 2023-07-13 DIAGNOSIS — I10 Essential (primary) hypertension: Secondary | ICD-10-CM

## 2023-07-13 HISTORY — DX: Personal history of urinary calculi: Z87.442

## 2023-07-13 HISTORY — DX: Calculus of kidney: N20.0

## 2023-07-13 HISTORY — DX: Pneumonia, unspecified organism: J18.9

## 2023-07-13 HISTORY — DX: Tubulo-interstitial nephritis, not specified as acute or chronic: N12

## 2023-07-13 NOTE — Patient Instructions (Addendum)
Your procedure is scheduled on: 07/24/23 - Monday Report to the Registration Desk on the 1st floor of the [facility]. To find out your arrival time, please call [phone] between 1PM - 3PM on: 07/21/23 - Friday If your arrival time is 6:00 am, do not arrive before that time as the [facility] entrance doors do not open until 6:00 am.  REMEMBER: Instructions that are not followed completely may result in serious medical risk, up to and including death; or upon the discretion of your surgeon and anesthesiologist your surgery may need to be rescheduled.   DAY PRIOR TO SURGERY- CLEAR LIQUIDS ONLY The following items are often part of a clear liquid diet:   Water, plain, carbonated or flavored. Fruit juices without pulp, such as apple or white grape juice. Carbonated drinks- such as sprite, starry etc.  Gelatin without fruit. Tea or coffee without milk, cream or nondairy creamer. Sports drinks. Clear, fat-free broth such as bouillon or consomme. Hard candy, such as lemon drops or peppermint rounds. Ice pops without milk, bits of fruit, seeds or nuts.   Drink plenty of clear liquids all day to avoid getting dehydrated (Water, juice, soda, coffee, tea, bouillon, jello, etc.)  Do not eat food or drink any fluids after midnight the night before surgery.  No gum chewing or hard candies.  One week prior to surgery:starting 07/17/23,   - Stop Anti-inflammatories (NSAIDS) such as Advil, Aleve, Ibuprofen, Motrin, Naproxen, Naprosyn and Aspirin based products such as Excedrin, Goody's Powder, BC Powder.  - Stop ANY OVER THE COUNTER supplements until after surgery.  You may take Tylenol if needed for pain up until the day of surgery.  TAKE ONLY THESE MEDICATIONS THE MORNING OF SURGERY WITH A SIP OF WATER:  fluticasone (FLONASE)    No Alcohol for 24 hours before or after surgery.  No Smoking including e-cigarettes for 24 hours before surgery.  No chewable tobacco products for at  least 6 hours before surgery.  No nicotine patches on the day of surgery.  Do not use any "recreational" drugs for at least a week (preferably 2 weeks) before your surgery.  Please be advised that the combination of cocaine and anesthesia may have negative outcomes, up to and including death. If you test positive for cocaine, your surgery will be cancelled.  On the morning of surgery brush your teeth with toothpaste and water, you may rinse your mouth with mouthwash if you wish. Do not swallow any toothpaste or mouthwash.  Use CHG Soap or wipes as directed on instruction sheet.  Do not wear jewelry, make-up, hairpins, clips or nail polish.  Do not wear lotions, powders, or perfumes.   Do not shave body hair from the neck down 48 hours before surgery.  Contact lenses, hearing aids and dentures may not be worn into surgery.  Do not bring valuables to the hospital. [facility] is not responsible for any missing/lost belongings or valuables.   Notify your doctor if there is any change in your medical condition (cold, fever, infection).  Wear comfortable clothing (specific to your surgery type) to the hospital.  After surgery, you can help prevent lung complications by doing breathing exercises.  Take deep breaths and cough every 1-2 hours. Your doctor may order a device called an Incentive Spirometer to help you take deep breaths. When coughing or sneezing, hold a pillow firmly against your incision with both hands. This is called "splinting." Doing this helps protect your incision. It also decreases belly discomfort.  If you are being admitted to the hospital overnight, leave your suitcase in the car. After surgery it may be brought to your room.  In case of increased patient census, it may be necessary for you, the patient, to continue your postoperative care in the Same Day Surgery department.  If you are being discharged the day of surgery, you will not be allowed to drive  home. You will need a responsible individual to drive you home and stay with you for 24 hours after surgery.   If you are taking public transportation, you will need to have a responsible individual with you.  Please call the Pre-admissions Testing Dept. at [phone] if you have any questions about these instructions.  Surgery Visitation Policy:  Patients having surgery or a procedure may have two visitors.  Children under the age of 28 must have an adult with them who is not the patient.  Inpatient Visitation:    Visiting hours are 7 a.m. to 8 p.m. Up to four visitors are allowed at one time in a patient room. The visitors may rotate out with other people during the day.  One visitor age 41 or older may stay with the patient overnight and must be in the room by 8 p.m.    Preparing for Surgery with CHLORHEXIDINE GLUCONATE (CHG) Soap  Chlorhexidine Gluconate (CHG) Soap  o An antiseptic cleaner that kills germs and bonds with the skin to continue killing germs even after washing  o Used for showering the night before surgery and morning of surgery  Before surgery, you can play an important role by reducing the number of germs on your skin.  CHG (Chlorhexidine gluconate) soap is an antiseptic cleanser which kills germs and bonds with the skin to continue killing germs even after washing.  Please do not use if you have an allergy to CHG or antibacterial soaps. If your skin becomes reddened/irritated stop using the CHG.  1. Shower the NIGHT BEFORE SURGERY and the MORNING OF SURGERY with CHG soap.  2. If you choose to wash your hair, wash your hair first as usual with your normal shampoo.  3. After shampooing, rinse your hair and body thoroughly to remove the shampoo.  4. Use CHG as you would any other liquid soap. You can apply CHG directly to the skin and wash gently with a scrungie or a clean washcloth.  5. Apply the CHG soap to your body only from the neck down. Do not use  on open wounds or open sores. Avoid contact with your eyes, ears, mouth, and genitals (private parts). Wash face and genitals (private parts) with your normal soap.  6. Wash thoroughly, paying special attention to the area where your surgery will be performed.  7. Thoroughly rinse your body with warm water.  8. Do not shower/wash with your normal soap after using and rinsing off the CHG soap.  9. Pat yourself dry with a clean towel.  10. Wear clean pajamas to bed the night before surgery.  12. Place clean sheets on your bed the night of your first shower and do not sleep with pets.  13. Shower again with the CHG soap on the day of surgery prior to arriving at the hospital.  14. Do not apply any deodorants/lotions/powders.  15. Please wear clean clothes to the hospital.

## 2023-07-14 ENCOUNTER — Encounter
Admission: RE | Admit: 2023-07-14 | Discharge: 2023-07-14 | Disposition: A | Discharge status: Home / Self Care | Payer: BC Managed Care – PPO | Source: Ambulatory Visit | Attending: Urology | Admitting: Urology

## 2023-07-14 DIAGNOSIS — Z01818 Encounter for other preprocedural examination: Secondary | ICD-10-CM | POA: Diagnosis not present

## 2023-07-14 DIAGNOSIS — N261 Atrophy of kidney (terminal): Secondary | ICD-10-CM | POA: Insufficient documentation

## 2023-07-14 DIAGNOSIS — N12 Tubulo-interstitial nephritis, not specified as acute or chronic: Secondary | ICD-10-CM | POA: Insufficient documentation

## 2023-07-14 DIAGNOSIS — I10 Essential (primary) hypertension: Secondary | ICD-10-CM | POA: Insufficient documentation

## 2023-07-14 LAB — URINALYSIS, COMPLETE (UACMP) WITH MICROSCOPIC
Bacteria, UA: NONE SEEN
Bilirubin Urine: NEGATIVE
Glucose, UA: NEGATIVE mg/dL
Ketones, ur: NEGATIVE mg/dL
Leukocytes,Ua: NEGATIVE
Nitrite: NEGATIVE
Protein, ur: NEGATIVE mg/dL
RBC / HPF: >50 RBC/hpf (ref 0–5)
Specific Gravity, Urine: 1.02 (ref 1.005–1.030)
pH: 7 (ref 5.0–8.0)

## 2023-07-14 LAB — CBC
HCT: 44.9 % (ref 36.0–46.0)
Hemoglobin: 14.5 g/dL (ref 12.0–15.0)
MCH: 29 pg (ref 26.0–34.0)
MCHC: 32.3 g/dL (ref 30.0–36.0)
MCV: 89.8 fL (ref 80.0–100.0)
Platelets: 413 10*3/uL — ABNORMAL HIGH (ref 150–400)
RBC: 5 MIL/uL (ref 3.87–5.11)
RDW: 13.7 % (ref 11.5–15.5)
WBC: 9.1 10*3/uL (ref 4.0–10.5)
nRBC: 0 % (ref 0.0–0.2)

## 2023-07-14 LAB — PROTIME-INR
INR: 0.9 (ref 0.8–1.2)
Prothrombin Time: 12.8 seconds (ref 11.4–15.2)

## 2023-07-14 LAB — BASIC METABOLIC PANEL
Anion gap: 10 (ref 5–15)
BUN: 23 mg/dL — ABNORMAL HIGH (ref 6–20)
CO2: 30 mmol/L (ref 22–32)
Calcium: 9.4 mg/dL (ref 8.9–10.3)
Chloride: 101 mmol/L (ref 98–111)
Creatinine, Ser: 1.02 mg/dL — ABNORMAL HIGH (ref 0.44–1.00)
GFR, Estimated: >60 mL/min (ref >60)
Glucose, Bld: 69 mg/dL — ABNORMAL LOW (ref 70–99)
Potassium: 3.1 mmol/L — ABNORMAL LOW (ref 3.5–5.1)
Sodium: 141 mmol/L (ref 135–145)

## 2023-07-14 NOTE — Progress Notes (Signed)
  [facility] Perioperative Services: Pre-Admission/Anesthesia Testing  Abnormal Lab Notification and Treatment Plan of Care   Date: 07/14/23  Name: Melanie Blankenship MRN:   [id number]  Re: Abnormal labs noted during PAT appointment   Notified:  Provider Name Provider Role Notification Mode  Vanna Scotland, MD Urology (Surgeon) Routed and/or faxed via Nena Jordan, MD Primary Care Provider Routed and/or faxed via [facility]   Clinical Information and Notes:  ABNORMAL LAB VALUE(S): Lab Results  Component Value Date   K 3.1 (L) 07/14/2023   Melanie Blankenship is scheduled for an elective HAND ASSISTED LAPAROSCOPIC RADICAL NEPHRECTOMY (Right) on 07/24/2023. In review of her medication reconciliation, it is noted that the patient is taking prescribed diuretic medications (HCTZ 25 mg) daily.   Please note, in efforts to promote a safe and effective anesthetic course, per current guidelines/standards set by the [facility] anesthesia team, the minimal acceptable K+ level for the patient to proceed with general anesthesia is 3.0 mmol/L. With that being said, if the patient drops any lower, her elective procedure will need to be postponed until K+ is better optimized. In efforts to prevent case cancellation, will make efforts to optimize pre-surgical K+ level so that patient can safely undergo the planned surgical intervention.   Impression and Plan:  Melanie Blankenship found to be HYPOkalemic at 3.1 mmol/L on preoperative labs. She is on thiazide diuretic therapy. Communicated with patient to discuss results and plans for correction of noted electrolyte derangement. Patient has been prescribed K+ supplement in the past, however notes that she has not been taking it. It is not on her medication list (added by me today). Patient to restart K+ as previously prescribed (20 mEq) daily until surgery.   Encouraged patient to follow up with PCP about 2-3 weeks postoperatively to have labs  rechecked to ensure that levels are remaining within normal range. Discussed nutritional intake of K+ rich foods as an adjunctive way to keep her K+ levels normal; list of K+ rich foods verbally provided. Also mentioned ORS, however advised her not to rely solely on these drinks, as they are high in Na+, and she has a HTN diagnosis.   Will send copy of this note to surgeon and PCP to make them aware of K+ level and plans for correction. Discussed that PCP may elect to pursue a change in diuretic therapy to a K+ sparing type medication, or alternatively, they may consider adding a daily K+ supplement if levels remain low on recheck. Order entered to recheck K+ on the day of her surgery to ensure optimization. Wished patient the best of luck with her upcoming surgery and subsequent recovery. She was encouraged to return call to the PAT clinic, or to her surgeon's office, should any questions or concerns arise between now and the time of her surgery. Patient was appreciative of the care/concern expressed by PAT staff.   Quentin Mulling, MSN, APRN, FNP-C, CEN [facility]  Peri-operative Services Nurse Practitioner Phone: [phone] 07/14/23 3:43 PM  NOTE: This note has been prepared using Dragon dictation software. Despite my best ability to proofread, there is always the potential that unintentional transcriptional errors may still occur from this process.

## 2023-07-15 LAB — URINE CULTURE: Culture: NO GROWTH

## 2023-07-15 LAB — TYPE AND SCREEN
ABO/RH(D): B NEG
Antibody Screen: NEGATIVE

## 2023-07-23 MED ORDER — CHLORHEXIDINE GLUCONATE 0.12 % MT SOLN
15.0000 mL | Freq: Once | OROMUCOSAL | Status: AC
  Administered 2023-07-24: 15 mL via OROMUCOSAL

## 2023-07-23 MED ORDER — ORAL CARE MOUTH RINSE: 15.0000 mL | Freq: Once | OROMUCOSAL | Status: AC

## 2023-07-23 MED ORDER — LACTATED RINGERS IV SOLN: INTRAVENOUS | Status: DC

## 2023-07-23 MED ORDER — CHLORHEXIDINE GLUCONATE CLOTH 2 % EX PADS
6.0000 | MEDICATED_PAD | Freq: Once | CUTANEOUS | Status: AC
  Administered 2023-07-24: 6 via TOPICAL

## 2023-07-23 MED ORDER — CEFAZOLIN SODIUM-DEXTROSE 2-4 GM/100ML-% IV SOLN
2.0000 g | INTRAVENOUS | Status: AC
  Administered 2023-07-24: 2 g via INTRAVENOUS

## 2023-07-23 MED ORDER — FAMOTIDINE 20 MG PO TABS
20.0000 mg | ORAL_TABLET | Freq: Once | ORAL | Status: AC
  Administered 2023-07-24: 20 mg via ORAL

## 2023-07-24 ENCOUNTER — Encounter
Admission: RE | Disposition: A | Discharge status: Home / Self Care | Payer: Self-pay | Source: Home / Self Care | Attending: Urology

## 2023-07-24 ENCOUNTER — Encounter: Payer: Self-pay | Admitting: Urology

## 2023-07-24 ENCOUNTER — Inpatient Hospital Stay
Admission: RE | Admit: 2023-07-24 | Discharge: 2023-07-25 | DRG: 661 | Disposition: A | Discharge status: Home / Self Care | Payer: BC Managed Care – PPO | Attending: Urology | Admitting: Urology

## 2023-07-24 ENCOUNTER — Other Ambulatory Visit: Payer: Self-pay

## 2023-07-24 ENCOUNTER — Inpatient Hospital Stay: Payer: BC Managed Care – PPO | Admitting: Urgent Care

## 2023-07-24 DIAGNOSIS — E876 Hypokalemia: Secondary | ICD-10-CM | POA: Diagnosis not present

## 2023-07-24 DIAGNOSIS — Z01812 Encounter for preprocedural laboratory examination: Principal | ICD-10-CM

## 2023-07-24 DIAGNOSIS — N261 Atrophy of kidney (terminal): Secondary | ICD-10-CM | POA: Diagnosis not present

## 2023-07-24 DIAGNOSIS — Z87442 Personal history of urinary calculi: Secondary | ICD-10-CM

## 2023-07-24 DIAGNOSIS — Z91040 Latex allergy status: Secondary | ICD-10-CM | POA: Diagnosis not present

## 2023-07-24 DIAGNOSIS — N119 Chronic tubulo-interstitial nephritis, unspecified: Secondary | ICD-10-CM | POA: Diagnosis not present

## 2023-07-24 DIAGNOSIS — Z881 Allergy status to other antibiotic agents status: Secondary | ICD-10-CM

## 2023-07-24 DIAGNOSIS — G8929 Other chronic pain: Secondary | ICD-10-CM | POA: Diagnosis present

## 2023-07-24 DIAGNOSIS — Z79899 Other long term (current) drug therapy: Secondary | ICD-10-CM

## 2023-07-24 DIAGNOSIS — N269 Renal sclerosis, unspecified: Secondary | ICD-10-CM

## 2023-07-24 DIAGNOSIS — Z87440 Personal history of urinary (tract) infections: Secondary | ICD-10-CM

## 2023-07-24 DIAGNOSIS — N12 Tubulo-interstitial nephritis, not specified as acute or chronic: Secondary | ICD-10-CM | POA: Diagnosis not present

## 2023-07-24 DIAGNOSIS — T502X5A Adverse effect of carbonic-anhydrase inhibitors, benzothiadiazides and other diuretics, initial encounter: Secondary | ICD-10-CM

## 2023-07-24 DIAGNOSIS — I10 Essential (primary) hypertension: Secondary | ICD-10-CM | POA: Diagnosis not present

## 2023-07-24 DIAGNOSIS — N118 Other chronic tubulo-interstitial nephritis: Secondary | ICD-10-CM | POA: Diagnosis not present

## 2023-07-24 HISTORY — PX: LAPAROSCOPIC NEPHRECTOMY, HAND ASSISTED: SHX1929

## 2023-07-24 LAB — POCT I-STAT, CHEM 8
BUN: 12 mg/dL (ref 6–20)
Calcium, Ion: 1.16 mmol/L (ref 1.15–1.40)
Chloride: 102 mmol/L (ref 98–111)
Creatinine, Ser: 0.9 mg/dL (ref 0.44–1.00)
Glucose, Bld: 99 mg/dL (ref 70–99)
HCT: 44 % (ref 36.0–46.0)
Hemoglobin: 15 g/dL (ref 12.0–15.0)
Potassium: 3.6 mmol/L (ref 3.5–5.1)
Sodium: 138 mmol/L (ref 135–145)
TCO2: 28 mmol/L (ref 22–32)

## 2023-07-24 LAB — ABO/RH: ABO/RH(D): B NEG

## 2023-07-24 LAB — POCT PREGNANCY, URINE: Preg Test, Ur: NEGATIVE

## 2023-07-24 SURGERY — NEPHRECTOMY, HAND-ASSISTED, LAPAROSCOPIC
Anesthesia: General | Site: Abdomen | Laterality: Right

## 2023-07-24 MED ORDER — DIPHENHYDRAMINE HCL 50 MG/ML IJ SOLN: 12.5000 mg | Freq: Four times a day (QID) | INTRAMUSCULAR | Status: DC | PRN

## 2023-07-24 MED ORDER — DROPERIDOL 2.5 MG/ML IJ SOLN: 0.6250 mg | Freq: Once | INTRAMUSCULAR | Status: DC | PRN

## 2023-07-24 MED ORDER — TRAZODONE HCL 50 MG PO TABS
50.0000 mg | ORAL_TABLET | Freq: Every evening | ORAL | Status: DC | PRN
  Filled 2023-07-24: qty 1

## 2023-07-24 MED ORDER — DIPHENHYDRAMINE HCL 12.5 MG/5ML PO ELIX: 12.5000 mg | ORAL_SOLUTION | Freq: Four times a day (QID) | ORAL | Status: DC | PRN

## 2023-07-24 MED ORDER — MIDAZOLAM HCL 2 MG/2ML IJ SOLN
INTRAMUSCULAR | Status: DC | PRN
  Administered 2023-07-24: 2 mg via INTRAVENOUS

## 2023-07-24 MED ORDER — PROPOFOL 10 MG/ML IV BOLUS
INTRAVENOUS | Status: DC | PRN
  Administered 2023-07-24: 200 mg via INTRAVENOUS

## 2023-07-24 MED ORDER — KETAMINE HCL 10 MG/ML IJ SOLN
INTRAMUSCULAR | Status: DC | PRN
Start: 2023-07-24 — End: 2023-07-24
  Administered 2023-07-24: 25 mg via INTRAVENOUS
  Administered 2023-07-24: 15 mg via INTRAVENOUS
  Administered 2023-07-24: 10 mg via INTRAVENOUS

## 2023-07-24 MED ORDER — FENTANYL CITRATE (PF) 100 MCG/2ML IJ SOLN
25.0000 ug | INTRAMUSCULAR | Status: DC | PRN
  Administered 2023-07-24: 25 ug via INTRAVENOUS
  Administered 2023-07-24: 50 ug via INTRAVENOUS
  Administered 2023-07-24 (×3): 25 ug via INTRAVENOUS

## 2023-07-24 MED ORDER — BUPIVACAINE LIPOSOME 1.3 % IJ SUSP
INTRAMUSCULAR | Status: AC
  Filled 2023-07-24: qty 20

## 2023-07-24 MED ORDER — OXYBUTYNIN CHLORIDE 5 MG PO TABS
5.0000 mg | ORAL_TABLET | Freq: Three times a day (TID) | ORAL | Status: DC | PRN
  Administered 2023-07-25: 5 mg via ORAL
  Filled 2023-07-24: qty 1

## 2023-07-24 MED ORDER — SODIUM CHLORIDE 0.9 % IV SOLN: INTRAVENOUS | Status: DC

## 2023-07-24 MED ORDER — HYDROCHLOROTHIAZIDE 25 MG PO TABS
25.0000 mg | ORAL_TABLET | Freq: Every day | ORAL | Status: DC
  Administered 2023-07-24 – 2023-07-25 (×2): 25 mg via ORAL
  Filled 2023-07-24 (×2): qty 1

## 2023-07-24 MED ORDER — FENTANYL CITRATE (PF) 100 MCG/2ML IJ SOLN
INTRAMUSCULAR | Status: DC | PRN
  Administered 2023-07-24 (×5): 50 ug via INTRAVENOUS

## 2023-07-24 MED ORDER — OXYCODONE-ACETAMINOPHEN 5-325 MG PO TABS
1.0000 | ORAL_TABLET | ORAL | Status: DC | PRN
  Administered 2023-07-24 – 2023-07-25 (×5): 2 via ORAL
  Filled 2023-07-24 (×6): qty 2

## 2023-07-24 MED ORDER — SURGIFLO WITH THROMBIN (HEMOSTATIC MATRIX KIT) OPTIME
TOPICAL | Status: DC | PRN
  Administered 2023-07-24: 1 via TOPICAL

## 2023-07-24 MED ORDER — ROCURONIUM BROMIDE 100 MG/10ML IV SOLN
INTRAVENOUS | Status: DC | PRN
  Administered 2023-07-24: 50 mg via INTRAVENOUS
  Administered 2023-07-24: 20 mg via INTRAVENOUS

## 2023-07-24 MED ORDER — OXYCODONE HCL 5 MG PO TABS
5.0000 mg | ORAL_TABLET | ORAL | Status: AC | PRN
  Administered 2023-07-24: 5 mg via ORAL

## 2023-07-24 MED ORDER — MIDAZOLAM HCL 2 MG/2ML IJ SOLN
INTRAMUSCULAR | Status: AC
  Filled 2023-07-24: qty 2

## 2023-07-24 MED ORDER — ONDANSETRON HCL 4 MG/2ML IJ SOLN: 4.0000 mg | INTRAMUSCULAR | Status: DC | PRN

## 2023-07-24 MED ORDER — FLUTICASONE PROPIONATE 50 MCG/ACT NA SUSP
2.0000 | Freq: Every day | NASAL | Status: DC
  Administered 2023-07-25: 2 via NASAL
  Filled 2023-07-24: qty 16

## 2023-07-24 MED ORDER — ONDANSETRON HCL 4 MG/2ML IJ SOLN
INTRAMUSCULAR | Status: AC
  Filled 2023-07-24: qty 2

## 2023-07-24 MED ORDER — ACETAMINOPHEN 10 MG/ML IV SOLN
INTRAVENOUS | Status: AC
  Filled 2023-07-24: qty 100

## 2023-07-24 MED ORDER — HEPARIN SODIUM (PORCINE) 5000 UNIT/ML IJ SOLN
5000.0000 [IU] | Freq: Three times a day (TID) | INTRAMUSCULAR | Status: DC
  Administered 2023-07-24 – 2023-07-25 (×3): 5000 [IU] via SUBCUTANEOUS
  Filled 2023-07-24 (×3): qty 1

## 2023-07-24 MED ORDER — CEFAZOLIN SODIUM-DEXTROSE 2-4 GM/100ML-% IV SOLN
INTRAVENOUS | Status: AC
  Filled 2023-07-24: qty 100

## 2023-07-24 MED ORDER — HEMOSTATIC AGENTS (NO CHARGE) OPTIME
TOPICAL | Status: DC | PRN
  Administered 2023-07-24: 1 via TOPICAL

## 2023-07-24 MED ORDER — PROPOFOL 10 MG/ML IV BOLUS
INTRAVENOUS | Status: AC
  Filled 2023-07-24: qty 20

## 2023-07-24 MED ORDER — STERILE WATER FOR IRRIGATION IR SOLN
Status: DC | PRN
  Administered 2023-07-24: 300 mL

## 2023-07-24 MED ORDER — ROCURONIUM BROMIDE 10 MG/ML (PF) SYRINGE
PREFILLED_SYRINGE | INTRAVENOUS | Status: AC
  Filled 2023-07-24: qty 10

## 2023-07-24 MED ORDER — BUPIVACAINE LIPOSOME 1.3 % IJ SUSP
INTRAMUSCULAR | Status: DC | PRN
  Administered 2023-07-24: 50 mL

## 2023-07-24 MED ORDER — FENTANYL CITRATE (PF) 100 MCG/2ML IJ SOLN
INTRAMUSCULAR | Status: AC
  Filled 2023-07-24: qty 2

## 2023-07-24 MED ORDER — CHLORHEXIDINE GLUCONATE 0.12 % MT SOLN
OROMUCOSAL | Status: AC
  Filled 2023-07-24: qty 15

## 2023-07-24 MED ORDER — OXYCODONE HCL 5 MG PO TABS
ORAL_TABLET | ORAL | Status: AC
  Filled 2023-07-24: qty 1

## 2023-07-24 MED ORDER — FAMOTIDINE 20 MG PO TABS
ORAL_TABLET | ORAL | Status: AC
  Filled 2023-07-24: qty 1

## 2023-07-24 MED ORDER — DEXAMETHASONE SODIUM PHOSPHATE 10 MG/ML IJ SOLN
INTRAMUSCULAR | Status: AC
  Filled 2023-07-24: qty 1

## 2023-07-24 MED ORDER — DEXAMETHASONE SODIUM PHOSPHATE 10 MG/ML IJ SOLN
INTRAMUSCULAR | Status: DC | PRN
  Administered 2023-07-24: 10 mg via INTRAVENOUS

## 2023-07-24 MED ORDER — MORPHINE SULFATE (PF) 2 MG/ML IV SOLN
2.0000 mg | INTRAVENOUS | Status: DC | PRN
  Administered 2023-07-24 – 2023-07-25 (×2): 2 mg via INTRAVENOUS
  Filled 2023-07-24 (×2): qty 1

## 2023-07-24 MED ORDER — LIDOCAINE HCL (CARDIAC) PF 100 MG/5ML IV SOSY
PREFILLED_SYRINGE | INTRAVENOUS | Status: DC | PRN
  Administered 2023-07-24: 100 mg via INTRAVENOUS

## 2023-07-24 MED ORDER — DOCUSATE SODIUM 100 MG PO CAPS
100.0000 mg | ORAL_CAPSULE | Freq: Two times a day (BID) | ORAL | Status: DC
  Administered 2023-07-24 – 2023-07-25 (×2): 100 mg via ORAL
  Filled 2023-07-24 (×2): qty 1

## 2023-07-24 MED ORDER — CEFAZOLIN SODIUM-DEXTROSE 1-4 GM/50ML-% IV SOLN
1.0000 g | Freq: Three times a day (TID) | INTRAVENOUS | Status: AC
  Administered 2023-07-24 – 2023-07-25 (×2): 1 g via INTRAVENOUS
  Filled 2023-07-24 (×2): qty 50

## 2023-07-24 MED ORDER — KETAMINE HCL 50 MG/5ML IJ SOSY
PREFILLED_SYRINGE | INTRAMUSCULAR | Status: AC
  Filled 2023-07-24: qty 5

## 2023-07-24 MED ORDER — LIDOCAINE HCL (PF) 2 % IJ SOLN
INTRAMUSCULAR | Status: AC
  Filled 2023-07-24: qty 5

## 2023-07-24 MED ORDER — 0.9 % SODIUM CHLORIDE (POUR BTL) OPTIME
TOPICAL | Status: DC | PRN
Start: 2023-07-24 — End: 2023-07-24
  Administered 2023-07-24: 500 mL

## 2023-07-24 MED ORDER — SUGAMMADEX SODIUM 200 MG/2ML IV SOLN
INTRAVENOUS | Status: DC | PRN
  Administered 2023-07-24: 200 mg via INTRAVENOUS

## 2023-07-24 MED ORDER — ACETAMINOPHEN 10 MG/ML IV SOLN
INTRAVENOUS | Status: DC | PRN
  Administered 2023-07-24: 1000 mg via INTRAVENOUS

## 2023-07-24 MED ORDER — ACETAMINOPHEN 325 MG PO TABS: 650.0000 mg | ORAL_TABLET | ORAL | Status: DC | PRN

## 2023-07-24 MED ORDER — BUPIVACAINE HCL (PF) 0.5 % IJ SOLN
INTRAMUSCULAR | Status: AC
  Filled 2023-07-24: qty 30

## 2023-07-24 MED ORDER — ONDANSETRON HCL 4 MG/2ML IJ SOLN
INTRAMUSCULAR | Status: DC | PRN
  Administered 2023-07-24: 4 mg via INTRAVENOUS

## 2023-07-24 SURGICAL SUPPLY — 77 items
ADH SKN CLS APL DERMABOND .7 (GAUZE/BANDAGES/DRESSINGS) ×2
AGENT HMST KT MTR STRL THRMB (HEMOSTASIS) ×1
APL ESCP 34 STRL LF DISP (HEMOSTASIS)
APL PRP STRL LF DISP 70% ISPRP (MISCELLANEOUS)
APPLICATOR SURGIFLO ENDO (HEMOSTASIS) IMPLANT
APPLIER CLIP ROT 10 11.4 M/L (STAPLE)
APPLIER CLIP ROT 13.4 12 LRG (CLIP)
APR CLP LRG 13.4X12 ROT 20 MLT (CLIP)
APR CLP MED LRG 11.4X10 (STAPLE)
BAG LAPAROSCOPIC 12 15 PORT 16 (BASKET) ×1 IMPLANT
BAG RETRIEVAL 12/15 (BASKET) ×1
CHLORAPREP W/TINT 26 (MISCELLANEOUS) ×1 IMPLANT
CLIP APPLIE ROT 10 11.4 M/L (STAPLE) IMPLANT
CLIP APPLIE ROT 13.4 12 LRG (CLIP) IMPLANT
CLIP LIGATING HEM O LOK PURPLE (MISCELLANEOUS) ×1 IMPLANT
DERMABOND ADVANCED .7 DNX12 (GAUZE/BANDAGES/DRESSINGS) ×2 IMPLANT
DRAPE INCISE IOBAN 66X45 STRL (DRAPES) ×1 IMPLANT
DRAPE STERI POUCH LG 24X46 STR (DRAPES) ×1 IMPLANT
DRAPE SURG 17X11 SM STRL (DRAPES) ×4 IMPLANT
DRSG TEGADERM 2-3/8X2-3/4 SM (GAUZE/BANDAGES/DRESSINGS) IMPLANT
DRSG TEGADERM 4X4.75 (GAUZE/BANDAGES/DRESSINGS) IMPLANT
DRSG TELFA 3X8 NADH STRL (GAUZE/BANDAGES/DRESSINGS) IMPLANT
ELECT REM PT RETURN 9FT ADLT (ELECTROSURGICAL) ×1
ELECTRODE REM PT RTRN 9FT ADLT (ELECTROSURGICAL) ×1 IMPLANT
GLOVE BIO SURGEON STRL SZ 6.5 (GLOVE) ×2 IMPLANT
GLOVE SURG UNDER LTX SZ6.5 (GLOVE) ×1 IMPLANT
GOWN STRL REUS W/ TWL LRG LVL3 (GOWN DISPOSABLE) ×3 IMPLANT
GOWN STRL REUS W/TWL LRG LVL3 (GOWN DISPOSABLE) ×3
GRASPER SUT TROCAR 14GX15 (MISCELLANEOUS) ×1 IMPLANT
HANDLE YANKAUER SUCT BULB TIP (MISCELLANEOUS) ×1 IMPLANT
HEMOSTAT SURGICEL 2X14 (HEMOSTASIS) IMPLANT
IRRIGATION STRYKERFLOW (MISCELLANEOUS) ×1 IMPLANT
IRRIGATOR STRYKERFLOW (MISCELLANEOUS) ×1
KIT PINK PAD W/HEAD ARE REST (MISCELLANEOUS) ×1
KIT PINK PAD W/HEAD ARM REST (MISCELLANEOUS) ×1 IMPLANT
KIT TURNOVER KIT A (KITS) ×1 IMPLANT
KITTNER LAPARASCOPIC 5X40 (MISCELLANEOUS) ×1 IMPLANT
L-HOOK LAP DISP 36CM (ELECTROSURGICAL) ×1
LABEL OR SOLS (LABEL) ×1 IMPLANT
LHOOK LAP DISP 36CM (ELECTROSURGICAL) ×1 IMPLANT
LIGASURE LAP ATLAS 10MM 37CM (INSTRUMENTS) ×1 IMPLANT
LOOP VESSEL MAXI 1X406 RED (MISCELLANEOUS) ×1 IMPLANT
MANIFOLD NEPTUNE II (INSTRUMENTS) ×1 IMPLANT
NDL HYPO 21X1.5 SAFETY (NEEDLE) ×1 IMPLANT
NEEDLE HYPO 21X1.5 SAFETY (NEEDLE) ×1 IMPLANT
PACK LAP CHOLECYSTECTOMY (MISCELLANEOUS) ×1 IMPLANT
PENCIL SMOKE EVACUATOR (MISCELLANEOUS) ×1 IMPLANT
RELOAD STAPLE 45 2.6 WHT THIN (STAPLE) IMPLANT
RELOAD STAPLE 60 2.6 WHT THN (STAPLE) IMPLANT
RELOAD STAPLER WHITE 60MM (STAPLE) ×1 IMPLANT
SCISSORS METZENBAUM CVD 33 (INSTRUMENTS) ×1 IMPLANT
SET TUBE SMOKE EVAC HIGH FLOW (TUBING) ×1 IMPLANT
SPONGE T-LAP 18X18 ~~LOC~~+RFID (SPONGE) ×1 IMPLANT
STAPLE ECHEON FLEX 60 POW ENDO (STAPLE) IMPLANT
STAPLE RELOAD 45 WHT (STAPLE) IMPLANT
STAPLER RELOAD WHITE 60MM (STAPLE) ×1
STAPLER SKIN PROX 35W (STAPLE) ×1 IMPLANT
STAPLER VASCULAR ECHELON 35 (CUTTER) IMPLANT
SURGIFLO W/THROMBIN 8M KIT (HEMOSTASIS) IMPLANT
SUT MNCRL 4-0 (SUTURE) ×2
SUT MNCRL 4-0 27XMFL (SUTURE) ×2
SUT MNCRL AB 4-0 PS2 18 (SUTURE) ×2 IMPLANT
SUT PDS AB 1 CT1 36 (SUTURE) IMPLANT
SUT PDS AB 1 TP1 96 (SUTURE) ×1 IMPLANT
SUT VIC AB 0 CT1 36 (SUTURE) ×2 IMPLANT
SUT VIC AB 4-0 FS2 27 (SUTURE) ×1 IMPLANT
SUT VICRYL 0 UR6 27IN ABS (SUTURE) ×1 IMPLANT
SUTURE MNCRL 4-0 27XMF (SUTURE) IMPLANT
SYS LAPSCP GELPORT 120MM (MISCELLANEOUS) ×1
SYSTEM LAPSCP GELPORT 120MM (MISCELLANEOUS) ×1 IMPLANT
TRAP FLUID SMOKE EVACUATOR (MISCELLANEOUS) ×1 IMPLANT
TRAY FOLEY MTR SLVR 16FR STAT (SET/KITS/TRAYS/PACK) ×1 IMPLANT
TROCAR ENDOPATH XCEL 12X100 BL (ENDOMECHANICALS) ×1 IMPLANT
TROCAR Z-THREAD FIOS 12X100MM (TROCAR) ×1 IMPLANT
TROCAR Z-THREAD FIOS 5X100MM (TROCAR) IMPLANT
WATER STERILE IRR 3000ML UROMA (IV SOLUTION) ×1 IMPLANT
WATER STERILE IRR 500ML POUR (IV SOLUTION) ×1 IMPLANT

## 2023-07-24 NOTE — Transfer of Care (Signed)
Immediate Anesthesia Transfer of Care Note  Patient: Melanie Blankenship  Procedure(s) Performed: HAND ASSISTED LAPAROSCOPIC RADICAL NEPHRECTOMY (Right: Abdomen)  Patient Location: PACU  Anesthesia Type:General  Level of Consciousness: drowsy  Airway & Oxygen Therapy: Patient Spontanous Breathing and Patient connected to face mask oxygen  Post-op Assessment: Report given to RN and Post -op Vital signs reviewed and stable  Post vital signs: Reviewed and stable  Last Vitals:  Vitals Value Taken Time  BP 164/104 07/24/23 1321  Temp 97.5 07/24/23   1321  Pulse 100 07/24/23 1324  Resp 15 07/24/23  1324  SpO2 100 % 07/24/23 1324  Vitals shown include unfiled device data.  Last Pain:  Vitals:   07/24/23 0927  TempSrc: Temporal  PainSc: 4          Complications: No notable events documented.

## 2023-07-24 NOTE — Anesthesia Procedure Notes (Signed)
Procedure Name: Intubation Date/Time: 07/24/2023 11:30 AM  Performed by: Carlester Kasparek, [place], CRNAPre-anesthesia Checklist: Patient identified, Patient being monitored, Timeout performed, Emergency Drugs available and Suction available Patient Re-evaluated:Patient Re-evaluated prior to induction Oxygen Delivery Method: Circle system utilized Preoxygenation: Pre-oxygenation with 100% oxygen Induction Type: IV induction Ventilation: Mask ventilation without difficulty Laryngoscope Size: 3 and McGraph Grade View: Grade I Tube type: Oral Tube size: 7.0 mm Number of attempts: 1 Airway Equipment and Method: Stylet Placement Confirmation: ETT inserted through vocal cords under direct vision, positive ETCO2 and breath sounds checked- equal and bilateral Secured at: 22 cm Tube secured with: Tape Dental Injury: Teeth and Oropharynx as per pre-operative assessment

## 2023-07-24 NOTE — Anesthesia Preprocedure Evaluation (Signed)
Anesthesia Evaluation  Patient identified by MRN, date of birth, ID band Patient awake    Reviewed: Allergy & Precautions, H&P , NPO status , Patient's Chart, lab work & pertinent test results, reviewed documented beta blocker date and time   History of Anesthesia Complications Negative for: history of anesthetic complications  Airway Mallampati: II  TM Distance: >3 FB Neck ROM: full    Dental  (+) Dental Advidsory Given, Teeth Intact   Pulmonary neg pulmonary ROS, Continuous Positive Airway Pressure Ventilation    Pulmonary exam normal breath sounds clear to auscultation       Cardiovascular Exercise Tolerance: Good hypertension, (-) angina (-) Past MI and (-) Cardiac Stents Normal cardiovascular exam(-) dysrhythmias (-) Valvular Problems/Murmurs Rhythm:regular Rate:Normal     Neuro/Psych negative neurological ROS  negative psych ROS   GI/Hepatic negative GI ROS, Neg liver ROS,,,  Endo/Other  negative endocrine ROS    Renal/GU Renal disease (kidney stones)  negative genitourinary   Musculoskeletal   Abdominal   Peds  Hematology negative hematology ROS (+)   Anesthesia Other Findings Past Medical History: No date: History of kidney stones No date: Hypertension No date: Kidney stones No date: Migraine No date: Nephrolithiasis No date: Pneumonia No date: Pyelonephritis of right kidney   Reproductive/Obstetrics negative OB ROS                             Anesthesia Physical Anesthesia Plan  ASA: 2  Anesthesia Plan: General   Post-op Pain Management:    Induction: Intravenous  PONV Risk Score and Plan: 3 and Ondansetron, Dexamethasone, Midazolam and Treatment may vary due to age or medical condition  Airway Management Planned: Oral ETT  Additional Equipment:   Intra-op Plan:   Post-operative Plan: Extubation in OR  Informed Consent: I have reviewed the patients History  and Physical, chart, labs and discussed the procedure including the risks, benefits and alternatives for the proposed anesthesia with the patient or authorized representative who has indicated his/her understanding and acceptance.     Dental Advisory Given  Plan Discussed with: Anesthesiologist, CRNA and Surgeon  Anesthesia Plan Comments:        Anesthesia Quick Evaluation

## 2023-07-24 NOTE — H&P (Signed)
07/24/23  [facility] CTAB  Melanie Blankenship 11-23-1981 [id number]   Referring provider: Dorien Chihuahua, MD [address] [place],  [place] [address]       Chief Complaint  Patient presents with   Follow-up      Renal scan results       HPI: 42 year-old female presents today for a follow-up.   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    In the interim, she also had an E-visit with a PA and diagnosed with a "simple cystitis" and given another 5 days of Bactrim; no labs were done.    She underwent a LASIX renal scan that showed split renal function 72.9% on the left, 23.1% on the right. No signs of obstruction bilaterally. There was relative atrophy on the right.   She is experiencing pain today at her visit, however she doesn't think she has a bladder infection. Even as a child she had issues with her kidney and getting infections. She has a kidney stone as well. She has had success with lithotripsy in the past.    PMH:     Past Medical History:  Diagnosis Date   Hypertension     Kidney stones     Migraine            Home Medications:  Allergies as of 05/10/2023         Reactions    Azithromycin Diarrhea, Hives    Latex Hives            Medication List           Accurate as of May 10, 2023  3:48 PM. If you have any questions, ask your nurse or doctor.              benzonatate 200 MG capsule Commonly known as: TESSALON Take 1 capsule (200 mg total) by mouth 2 (two) times daily as needed for cough.    cyanocobalamin 1000 MCG tablet Take 1,000 mcg by mouth daily.    fluticasone 50 MCG/ACT nasal spray Commonly known as: FLONASE Place 2 sprays into both nostrils daily.    hydrochlorothiazide 25 MG tablet Commonly known as: HYDRODIURIL Take 25 mg by mouth daily.    neomycin-polymyxin-hydrocortisone 3.5-10000-1 OTIC suspension Commonly known as:  CORTISPORIN Place 3 drops into the right ear 4 (four) times daily.    potassium chloride SA 20 MEQ tablet Commonly known as: KLOR-CON M Take 1 tablet (20 mEq total) by mouth daily for 3 days.    promethazine 25 MG tablet Commonly known as: PHENERGAN Take 25 mg by mouth every 6 (six) hours as needed for nausea/vomiting.    traZODone 100 MG tablet Commonly known as: DESYREL Take 50 mg by mouth at bedtime as needed.             Allergies:  Allergies      Allergies  Allergen Reactions   Azithromycin Diarrhea and Hives   Latex Hives        Social History:  reports that she has never smoked. She has never used smokeless tobacco. She reports that she does not drink alcohol. No history on file for drug use.     Physical Exam: BP (!) 141/77   Pulse (!) 106   LMP 05/01/2023 (Exact Date)   Constitutional:  Alert and oriented, No acute distress. HEENT: [place] AT, moist mucus membranes.  Trachea midline, no masses.  Neurologic: Grossly intact, no focal deficits, moving all 4 extremities. Psychiatric: Normal mood and affect.   Imaging: IMPRESSION: 1. Split renal function is equal to 76.9% from the left kidney and 23.1% from the right. 2. No signs of obstructive uropathy in either kidney. 3. Relative atrophy of the right kidney.     Electronically Signed   By: Signa Kell M.D.   On: 05/01/2023 10:42     Assessment & Plan:       1. Right renal apathy/ chronic pyelonephritis  - Minimal function. We discussed the option of nephrectomy both for pain relief as well as eliminating the nidus of chronic infection. Discussed the risk of bleeding, damage to our structures, hernias, DVT, pneumonia, ileus, et Karie Soda. Reviewed the intraoperative and post-operative course.  - Went over the risk of progression to end-stage renal disease, and solitary kidney precautions.   - Plan to have surgery in August. If she experiences any infections between now and then she will let us know. May  even consider a low dose anti-biotic for preventive measures. -Would also like to get a preoperative CT abdomen a few weeks before surgery to ensure there is no fragments or complications from #1 as well as assess the degree of inflammation of her right kidney for surgical planning purposes     [facility] [address] [place], [place] [address] [phone]

## 2023-07-24 NOTE — Op Note (Signed)
07/24/23   PREOP DIAGNOSIS: right atrophic kidney/recurrent pyelonephritis   POSTOPERATIVE DIAGNOSIS: Same as above  OPERATION PERFORMED: Hand-assisted laparoscopic right radical nephrectomy.  SURGEON: Vanna Scotland, MD  Assistant: Legrand Rams, MD  ANESTHESIA: General.   ESTIMATED BLOOD LOSS: 25  cc   DRAINS: 16-French Foley catheter   COMPLICATIONS: None.  Indications:  42 year old female with recurrent right pyelonephritis resulting in severe right renal atrophy and chronic pain.  Please see H&P.  DESCRIPTION OF OPERATION: Informed consent was obtained. The patient was marked on the right side. IV antibiotics were given for bacterial prophylaxis on call to the operating room. SCDs were provided for DVT prophylaxis. The patient was taken to the operating room and placed supine on the operating table. General anesthesia was provided. A Foley catheter was placed to drain the bladder.   The patient was positioned in right lateral decubitus with the right flank elevated about 70 degrees and the table flexed slightly. The right arm was placed in a padded airplane for support. Axillary roll was positioned. The patient was secured to the table with soft straps and then prepped and draped sterilely.   We had a time-out confirming the patient identification, planned procedure, surgical site, and all present were in agreement. All present were in agreement.   A 8 cm incision was made for the hand port in the right lower quadrant. The anterior fascia was incised and elevated. The rectus belly was retracted medially and the peritoneum was incised. An incisional block was provided with liposomal Marcaine. The GelPort was assembled. A 12 mm trocar was placed above the umbilicus and then the abdomen was insufflated. Laparoscopic survey revealed no abnormalities or injuries. An additional 12 mm trocar was just below  the subxiphoid. All port sites were then  infiltrated with liposomal Marcaine. Zero Vicryls were placed at the 12 mm trocar sites with the Carter-Thomason device for closure at the end of the case.  The white line of Toldt was incised. The colon was reflected from the spleen to the pelvis. Gerota's fascia was lifted up off the lower pole of the kidney. Vena cava were exposed.  The duodenum was identified and kocherized medially.  The upper pole of the kidney was mobilized off of the quadratus lumborum and further mobilized away from the liver .  There were good amount of liver adhesions which were taken down and the ended up placing an additional 5 mm port using a locking grasper to superior elevate the kidney for further retraction.  The adrenal gland was identified and spared during the upper pole dissection. The lower pole and lateral attachments were freed. The kidney was held laterally and the hilar dissection was completed such that the entire hilum could be encircled digitally en bloc.     The ureter was exposed, clipped distally using Weck clip and divided sharply. The ureter stump was confirmed hemostatic.  At this point, the hilum was divided with a 60 mm vascular load staple using the battery operated endovascular stapler en bloc.  Another staple fire was used in the medial upper quadrant and remainder of the kidney was freed.  The kidney was extracted through the gel port and passed off for pathological analysis.    Pneumoperitoneal pressure was reduced to 7 mmHg and the abdomen was inspected; hemostasis was confirmed. The 12 mm trocars were removed and the port sites closed with previously placed 0 Vicryl. The Gelport was removed.  Total of 50 cc of Marcaine and liposomal Marcaine were used.  The anterior fascia was closed with a running 1 PDS.  The subcutaneous tissue was closed using 0-0 vicryl. All the incisions were irrigated, patted dry, and then the skin was reapproximated with 4-0 Monocryl in a subcuticular  fashion. The wounds were cleaned and dried and covered with Dermabond. All sponge, needle, and instrument counts were reported correct x2.   The patient was awakened from anesthesia and transferred to recovery in stable condition. There were no complications. The patient tolerated the procedure well.   An assistant was required for this surgical procedure. The duties of the assistant included but were not limited to suctioning, passing suture, camera manipulation, retraction. This procedure would not be able to be performed without an assistant.   ______________________________    Vanna Scotland, MD

## 2023-07-25 ENCOUNTER — Encounter: Payer: Self-pay | Admitting: Urology

## 2023-07-25 DIAGNOSIS — N261 Atrophy of kidney (terminal): Secondary | ICD-10-CM

## 2023-07-25 DIAGNOSIS — N12 Tubulo-interstitial nephritis, not specified as acute or chronic: Secondary | ICD-10-CM

## 2023-07-25 LAB — BASIC METABOLIC PANEL
Anion gap: 10 (ref 5–15)
BUN: 9 mg/dL (ref 6–20)
CO2: 25 mmol/L (ref 22–32)
Calcium: 8.1 mg/dL — ABNORMAL LOW (ref 8.9–10.3)
Chloride: 103 mmol/L (ref 98–111)
Creatinine, Ser: 0.97 mg/dL (ref 0.44–1.00)
GFR, Estimated: >60 mL/min (ref >60)
Glucose, Bld: 120 mg/dL — ABNORMAL HIGH (ref 70–99)
Potassium: 3 mmol/L — ABNORMAL LOW (ref 3.5–5.1)
Sodium: 138 mmol/L (ref 135–145)

## 2023-07-25 LAB — CBC
HCT: 39.6 % (ref 36.0–46.0)
Hemoglobin: 12.8 g/dL (ref 12.0–15.0)
MCH: 29.7 pg (ref 26.0–34.0)
MCHC: 32.3 g/dL (ref 30.0–36.0)
MCV: 91.9 fL (ref 80.0–100.0)
Platelets: 372 10*3/uL (ref 150–400)
RBC: 4.31 MIL/uL (ref 3.87–5.11)
RDW: 14.2 % (ref 11.5–15.5)
WBC: 17.5 10*3/uL — ABNORMAL HIGH (ref 4.0–10.5)
nRBC: 0 % (ref 0.0–0.2)

## 2023-07-25 MED ORDER — DOCUSATE SODIUM 100 MG PO CAPS: 100.0000 mg | ORAL_CAPSULE | Freq: Two times a day (BID) | ORAL | 0 refills | Status: AC

## 2023-07-25 MED ORDER — POTASSIUM CHLORIDE CRYS ER 20 MEQ PO TBCR
20.0000 meq | EXTENDED_RELEASE_TABLET | Freq: Every day | ORAL | Status: DC
  Administered 2023-07-25: 20 meq via ORAL
  Filled 2023-07-25: qty 1

## 2023-07-25 MED ORDER — OXYCODONE-ACETAMINOPHEN 5-325 MG PO TABS: 1.0000 | ORAL_TABLET | Freq: Four times a day (QID) | ORAL | 0 refills | Status: DC | PRN

## 2023-07-25 NOTE — TOC CM/SW Note (Signed)
Transition of Care [facility]) - Inpatient Brief Assessment   Patient Details  Name: ALMENDRA HORMAN MRN: [id number] Date of Birth: 1981-11-16  Transition of [facility] CM/SW Contact:    Margarito Liner, LCSW Phone Number: 07/25/2023, 2:44 PM   Clinical Narrative: Patient has orders to discharge home today. CSW reviewed chart. No TOC needs identified. CSW signing off.  Transition of Care Asessment: Insurance and Status: Insurance coverage has been reviewed Patient has primary care physician: Yes Home environment has been reviewed: Single family home Prior level of function:: Not documented Prior/Current Home Services: No current home services Social Determinants of Health Reivew: SDOH reviewed no interventions necessary Readmission risk has been reviewed: Yes Transition of care needs: no transition of care needs at this time

## 2023-07-25 NOTE — Discharge Instructions (Signed)
Please continue potassium chloride, eat high potassium foods, and follow up with your PCP in 1-2 weeks to recheck your serum potassium level.

## 2023-07-25 NOTE — Discharge Summary (Signed)
Date of admission: 07/24/2023  Date of discharge: 07/25/2023  Admission diagnosis: Right atrophic kidney/recurrent pyelonephritis  Discharge diagnosis: Same as above  Secondary diagnoses:  Patient Active Problem List   Diagnosis Date Noted   Right renal atrophy 07/24/2023   History and Physical: For full details, please see admission history and physical. Briefly, Melanie Blankenship is a 42 y.o. year old patient admitted on 07/24/2023 for scheduled hand-assisted laparoscopic right radical nephrectomy with Dr. Apolinar Junes for management of an atrophic right kidney with recurrent pyelonephritis.   Creatinine stable this morning, 0.97.  Anticipated postoperative a.m. lab changes with hemoglobin slightly down, 12.8 and WBC count up, 17.5.  Home potassium chloride has been resumed with a.m. potassium 3.0.  She ambulated overnight and tolerated this well. Foley was discontinued on the morning and she subsequently voided.  She is tolerating p.o. and her pain remains well-controlled.  Physical Exam: Constitutional:  Alert and oriented, no acute distress, nontoxic appearing HEENT: [place], AT Cardiovascular: No clubbing, cyanosis, or edema Respiratory: Normal respiratory effort, no increased work of breathing GI: Abdomen is soft with appropriate postoperative tenderness without rigidity or rebound.  No flank ecchymosis.  Incisions clean, dry, and intact with overlying surgical adhesive.  Minimal bruising surrounding her incisions. Skin: No rashes, bruises or suspicious lesions Neurologic: Grossly intact, no focal deficits, moving all 4 extremities Psychiatric: Normal mood and affect   Hospital Course: Patient tolerated the procedure well.  She was then transferred to the floor after an uneventful PACU stay.  Her hospital course was uncomplicated.  On POD#1 she had met discharge criteria: was eating a regular diet, was up and ambulating independently,  pain was well controlled, was voiding without a catheter,  and was ready for discharge.  Laboratory values:  Recent Labs    07/24/23 0925 07/25/23 0618  WBC  --  17.5*  HGB 15.0 12.8  HCT 44.0 39.6   Recent Labs    07/24/23 0925 07/25/23 0618  NA 138 138  K 3.6 3.0*  CL 102 103  CO2  --  25  GLUCOSE 99 120*  BUN 12 9  CREATININE 0.90 0.97  CALCIUM  --  8.1*   Results for orders placed or performed during the hospital encounter of 07/14/23  Urine Culture     Status: None   Collection Time: 07/14/23 12:46 PM   Specimen: Urine, Clean Catch  Result Value Ref Range Status   Specimen Description   Final    URINE, CLEAN CATCH Performed at [facility], [address], [place], [place] [address]    Special Requests   Final    NONE Performed at [facility], [address] [place], [place] [address]    Culture   Final    NO GROWTH Performed at [facility], [address], [place], [place] [address]    Report Status 07/15/2023 FINAL  Final   Disposition: Home  Discharge instruction: The patient was instructed to be ambulatory but told to refrain from heavy lifting, strenuous activity, or driving.  She was instructed to continue potassium chloride, eat high potassium foods, and plan to follow-up with her PCP in 1 to 2 weeks to recheck her serum potassium.  Discharge medications:  Allergies as of 07/25/2023       Reactions   Azithromycin Diarrhea, Hives   Latex Hives        Medication List     TAKE these medications    acetaminophen 500 MG tablet Commonly known  as: TYLENOL Take 500 mg by mouth every 6 (six) hours as needed.   diphenhydrAMINE 25 mg capsule Commonly known as: BENADRYL Take 25 mg by mouth every 6 (six) hours as needed.   docusate sodium 100 MG capsule Commonly known as: COLACE Take 1 capsule (100 mg total) by mouth 2 (two) times daily.   fluticasone 50 MCG/ACT nasal spray Commonly known as: FLONASE Place 2 sprays into both nostrils daily.   hydrochlorothiazide 25 MG  tablet Commonly known as: HYDRODIURIL Take 25 mg by mouth daily.   oxyCODONE-acetaminophen 5-325 MG tablet Commonly known as: PERCOCET/ROXICET Take 1-2 tablets by mouth every 6 (six) hours as needed for moderate pain.   potassium chloride SA 20 MEQ tablet Commonly known as: KLOR-CON M Take 20 mEq by mouth daily.   traZODone 100 MG tablet Commonly known as: DESYREL Take 50 mg by mouth at bedtime as needed.        Followup:   Follow-up Information     Vanna Scotland, MD Follow up on 08/23/2023.   Specialty: Urology Why: For postop follow-up Contact information: [address] [place] [address] [phone]

## 2023-07-25 NOTE — Progress Notes (Signed)
Discharge instructions were reviewed with patient and family. IV was taken out. Questions were encourage and answered. Belongings were collected by family.

## 2023-07-26 NOTE — Anesthesia Postprocedure Evaluation (Signed)
Anesthesia Post Note  Patient: Melanie Blankenship  Procedure(s) Performed: HAND ASSISTED LAPAROSCOPIC RADICAL NEPHRECTOMY (Right: Abdomen)  Patient location during evaluation: PACU Anesthesia Type: General Level of consciousness: awake and alert Pain management: pain level controlled Vital Signs Assessment: post-procedure vital signs reviewed and stable Respiratory status: spontaneous breathing, nonlabored ventilation, respiratory function stable and patient connected to nasal cannula oxygen Cardiovascular status: blood pressure returned to baseline and stable Postop Assessment: no apparent nausea or vomiting Anesthetic complications: no   No notable events documented.   Last Vitals:  Vitals:   07/25/23 0414 07/25/23 0732  BP: 119/69 121/77  Pulse: 85 94  Resp: 16 16  Temp: 36.8 C 36.9 C  SpO2: 95% 98%    Last Pain:  Vitals:   07/25/23 0928  TempSrc:   PainSc: 6                  Lenard Simmer

## 2023-07-27 ENCOUNTER — Encounter: Payer: Self-pay | Admitting: Urology

## 2023-07-27 MED ORDER — OXYCODONE-ACETAMINOPHEN 5-325 MG PO TABS: 1.0000 | ORAL_TABLET | Freq: Four times a day (QID) | ORAL | 0 refills | Status: DC | PRN

## 2023-07-27 NOTE — Telephone Encounter (Signed)
See other message, sent to Dr Apolinar Junes to review

## 2023-08-01 ENCOUNTER — Ambulatory Visit: Payer: BC Managed Care – PPO | Admitting: Urology

## 2023-08-01 ENCOUNTER — Ambulatory Visit
Admission: RE | Admit: 2023-08-01 | Discharge: 2023-08-01 | Disposition: A | Discharge status: Home / Self Care | Payer: BC Managed Care – PPO | Source: Ambulatory Visit | Attending: Urology | Admitting: Urology

## 2023-08-01 ENCOUNTER — Encounter: Payer: Self-pay | Admitting: Urology

## 2023-08-01 VITALS — BP 146/95 | HR 96 | Ht 60.0 in | Wt 191.1 lb

## 2023-08-01 DIAGNOSIS — E876 Hypokalemia: Secondary | ICD-10-CM

## 2023-08-01 DIAGNOSIS — Z87448 Personal history of other diseases of urinary system: Secondary | ICD-10-CM

## 2023-08-01 DIAGNOSIS — R600 Localized edema: Secondary | ICD-10-CM | POA: Diagnosis not present

## 2023-08-01 NOTE — Telephone Encounter (Signed)
Patient advised. Order placed. Appointments made

## 2023-08-01 NOTE — Progress Notes (Signed)
Marcelle Overlie Plume,acting as a scribe for Melanie Scotland, MD.,have documented all relevant documentation on the behalf of Melanie Scotland, MD,as directed by  Melanie Scotland, MD while in the presence of Melanie Scotland, MD.  08/01/2023 1:27 PM   [name] August 13, 1981 [id number]  Referring provider: Dorien Chihuahua, MD [address] [place],  [place] [address]  Chief Complaint  Patient presents with   Edema    HPI: 42 year-old female who underwent laparoscopic hand-assist right nephrectomy on 07/24/2023 for right renal atrophy and recurrent pyelonephritis.   She messaged our office this morning indicating that she is experiencing bilateral lower extremity swelling since surgery, specifically the right leg was a lot bigger than the left and the right upper thigh is tight and hurts. As such, a STAT lower extremity duplex ultrasound was ordered and there is no evidence of a DVT, specifically on the right. She did have a low potassium going into surgery.   PMH: Past Medical History:  Diagnosis Date   History of kidney stones    Hypertension    Kidney stones    Migraine    Nephrolithiasis    Pneumonia    Pyelonephritis of right kidney     Surgical History: Past Surgical History:  Procedure Laterality Date   CHOLECYSTECTOMY  2018   EXTRACORPOREAL SHOCK WAVE LITHOTRIPSY Left 05/18/2023   Procedure: EXTRACORPOREAL SHOCK WAVE LITHOTRIPSY (ESWL);  Surgeon: Riki Altes, MD;  Location: [facility];  Service: Urology;  Laterality: Left;   LAPAROSCOPIC NEPHRECTOMY, HAND ASSISTED Right 07/24/2023   Procedure: HAND ASSISTED LAPAROSCOPIC RADICAL NEPHRECTOMY;  Surgeon: Melanie Scotland, MD;  Location: [facility];  Service: Urology;  Laterality: Right;   TONSILLECTOMY  2004    Home Medications:  Allergies as of 08/01/2023       Reactions   Azithromycin Diarrhea, Hives   Latex Hives        Medication List        Accurate as of August 01, 2023  1:27 PM. If you have any questions,  ask your nurse or doctor.          acetaminophen 500 MG tablet Commonly known as: TYLENOL Take 500 mg by mouth every 6 (six) hours as needed.   diphenhydrAMINE 25 mg capsule Commonly known as: BENADRYL Take 25 mg by mouth every 6 (six) hours as needed.   docusate sodium 100 MG capsule Commonly known as: COLACE Take 1 capsule (100 mg total) by mouth 2 (two) times daily.   fluticasone 50 MCG/ACT nasal spray Commonly known as: FLONASE Place 2 sprays into both nostrils daily.   hydrochlorothiazide 25 MG tablet Commonly known as: HYDRODIURIL Take 25 mg by mouth daily.   oxyCODONE-acetaminophen 5-325 MG tablet Commonly known as: PERCOCET/ROXICET Take 1-2 tablets by mouth every 6 (six) hours as needed for moderate pain.   potassium chloride SA 20 MEQ tablet Commonly known as: KLOR-CON M Take 20 mEq by mouth daily.   traZODone 100 MG tablet Commonly known as: DESYREL Take 50 mg by mouth at bedtime as needed.        Allergies:  Allergies  Allergen Reactions   Azithromycin Diarrhea and Hives   Latex Hives     Social History:  reports that she has never smoked. She has never used smokeless tobacco. She reports that she does not drink alcohol and does not use drugs.   Physical Exam: BP (!) 146/95   Pulse 96   Ht 5' (1.524 m)   Wt 191 lb  2 oz (86.7 kg)   LMP 07/09/2023 (Approximate)   BMI 37.33 kg/m   Constitutional:  Alert and oriented, No acute distress. HEENT: [place] AT, moist mucus membranes.  Trachea midline, no masses. Lower extremities: 1+ pitting bilateral edema, fairly symmetric on exam Abdomen: Incisions are well healed Neurologic: Grossly intact, no focal deficits, moving all 4 extremities. Psychiatric: Normal mood and affect.  Pertinent Imaging: EXAM: RIGHT LOWER EXTREMITY VENOUS DOPPLER ULTRASOUND   TECHNIQUE: Gray-scale sonography with graded compression, as well as color Doppler and duplex ultrasound were performed to evaluate the right lower  extremity deep venous systems from the level of the common femoral vein and including the common femoral, femoral, profunda femoral, popliteal and calf veins including the posterior tibial, peroneal and gastrocnemius veins when visible. Spectral Doppler was utilized to evaluate flow at rest and with distal augmentation maneuvers in the common femoral, femoral and popliteal veins. The contralateral common femoral vein was also evaluated for comparison.   COMPARISON:  None Available.   FINDINGS: RIGHT LOWER EXTREMITY   Common Femoral Vein: No evidence of thrombus. Normal compressibility, respiratory phasicity and response to augmentation.   Central Greater Saphenous Vein: No evidence of thrombus. Normal compressibility and flow on color Doppler imaging.   Central Profunda Femoral Vein: No evidence of thrombus. Normal compressibility and flow on color Doppler imaging.   Femoral Vein: No evidence of thrombus. Normal compressibility, respiratory phasicity and response to augmentation.   Popliteal Vein: No evidence of thrombus. Normal compressibility, respiratory phasicity and response to augmentation.   Calf Veins: No evidence of thrombus. Normal compressibility and flow on color Doppler imaging.   Other Findings:  None.   LEFT LOWER EXTREMITY   Common Femoral Vein: No evidence of thrombus. Normal compressibility, respiratory phasicity and response to augmentation.   IMPRESSION: No evidence of right lower extremity deep venous thrombosis.   Marliss Coots, MD   [facility]   [facility]     Electronically Signed   By: Marliss Coots M.D.   On: 08/01/2023 13:04  This was personally reviewed and I agree with the radiologic interpretation.   Assessment & Plan:    1. Right lower extremity edema - No evidence of DVT, which is reassuring - Recommend supportive care, mobilization - Compression/elevation as needed  2.  Hypokalemia - Repeat BMP today - Continue potassium supplementation.  3. Chronic right pyelonephritis - Status post right nephrectomy on 07/24/2023 - Pathology reviewed with patient today - No underlying malignancy - Advised to alternate Tylenol and ibuprofen for pain management, pending kidney function results. - Avoid long-term use of NSAIDs due to single kidney status.  Return if symptoms worsen or fail to improve.  I have reviewed the above documentation for accuracy and completeness, and I agree with the above.   Melanie Scotland, MD    [facility] [address], [address] [place], [place] [address] [phone]

## 2023-08-02 ENCOUNTER — Other Ambulatory Visit: Payer: Self-pay

## 2023-08-02 ENCOUNTER — Ambulatory Visit (INDEPENDENT_AMBULATORY_CARE_PROVIDER_SITE_OTHER): Payer: BC Managed Care – PPO | Admitting: Urology

## 2023-08-02 ENCOUNTER — Ambulatory Visit
Admission: RE | Admit: 2023-08-02 | Discharge: 2023-08-02 | Disposition: A | Discharge status: Home / Self Care | Payer: BC Managed Care – PPO | Source: Ambulatory Visit | Attending: Urology | Admitting: Urology

## 2023-08-02 VITALS — BP 147/87 | HR 98 | Ht 60.0 in | Wt 191.0 lb

## 2023-08-02 DIAGNOSIS — R339 Retention of urine, unspecified: Secondary | ICD-10-CM | POA: Diagnosis not present

## 2023-08-02 DIAGNOSIS — R7989 Other specified abnormal findings of blood chemistry: Secondary | ICD-10-CM

## 2023-08-02 DIAGNOSIS — R600 Localized edema: Secondary | ICD-10-CM | POA: Diagnosis not present

## 2023-08-02 DIAGNOSIS — N9989 Other postprocedural complications and disorders of genitourinary system: Secondary | ICD-10-CM | POA: Diagnosis not present

## 2023-08-02 DIAGNOSIS — N200 Calculus of kidney: Secondary | ICD-10-CM

## 2023-08-02 DIAGNOSIS — N179 Acute kidney failure, unspecified: Secondary | ICD-10-CM

## 2023-08-02 DIAGNOSIS — N132 Hydronephrosis with renal and ureteral calculous obstruction: Secondary | ICD-10-CM | POA: Diagnosis not present

## 2023-08-02 LAB — BASIC METABOLIC PANEL
BUN/Creatinine Ratio: 13 (ref 9–23)
BUN: 29 mg/dL — ABNORMAL HIGH (ref 6–24)
CO2: 26 mmol/L (ref 20–29)
Calcium: 9.2 mg/dL (ref 8.7–10.2)
Chloride: 100 mmol/L (ref 96–106)
Creatinine, Ser: 2.26 mg/dL — ABNORMAL HIGH (ref 0.57–1.00)
Glucose: 86 mg/dL (ref 70–99)
Potassium: 3.5 mmol/L (ref 3.5–5.2)
Sodium: 143 mmol/L (ref 134–144)
eGFR: 27 mL/min/{1.73_m2} — ABNORMAL LOW (ref >59)

## 2023-08-02 NOTE — Progress Notes (Signed)
Marcelle Overlie Plume,acting as a scribe for Vanna Scotland, MD.,have documented all relevant documentation on the behalf of Vanna Scotland, MD,as directed by  Vanna Scotland, MD while in the presence of Vanna Scotland, MD.  08/02/2023 1:55 PM   Melanie Blankenship [birth year] [id number]  Referring provider: Dorien Chihuahua, MD [address] [place],  [place] [address]  Chief Complaint  Patient presents with   Follow-up    HPI: 42 year-old female who presents today for an acute visit.   She was seen yesterday with a lower extremity edema. DVT was ruled out. We had drawn labs yesterday to check her renal function. Her creatinine was known to be markedly elevated to 2.26 along with slightly elevated BUN of 29.   She was called earlier this morning and send for a STAT renal ultrasound. This shows a mild left hydronephrosis which is new, a non-obstructing left lower pole calculus which is known but a significant PVR of 1461. She was called to come into the office today for her acute retention. She does not feel particularly distended and has no signs or symptoms of retention. A Foley catheter was placed today 16 French at bedside by a CMA and she had over 2000 mL of urine output.    PMH: Past Medical History:  Diagnosis Date   History of kidney stones    Hypertension    Kidney stones    Migraine    Nephrolithiasis    Pneumonia    Pyelonephritis of right kidney     Surgical History: Past Surgical History:  Procedure Laterality Date   CHOLECYSTECTOMY  2018   EXTRACORPOREAL SHOCK WAVE LITHOTRIPSY Left 05/18/2023   Procedure: EXTRACORPOREAL SHOCK WAVE LITHOTRIPSY (ESWL);  Surgeon: Riki Altes, MD;  Location: [facility];  Service: Urology;  Laterality: Left;   LAPAROSCOPIC NEPHRECTOMY, HAND ASSISTED Right 07/24/2023   Procedure: HAND ASSISTED LAPAROSCOPIC RADICAL NEPHRECTOMY;  Surgeon: Vanna Scotland, MD;  Location: [facility];  Service: Urology;  Laterality: Right;   TONSILLECTOMY  2004     Home Medications:  Allergies as of 08/02/2023       Reactions   Azithromycin Diarrhea, Hives   Latex Hives        Medication List        Accurate as of August 02, 2023  1:55 PM. If you have any questions, ask your nurse or doctor.          acetaminophen 500 MG tablet Commonly known as: TYLENOL Take 500 mg by mouth every 6 (six) hours as needed.   diphenhydrAMINE 25 mg capsule Commonly known as: BENADRYL Take 25 mg by mouth every 6 (six) hours as needed.   docusate sodium 100 MG capsule Commonly known as: COLACE Take 1 capsule (100 mg total) by mouth 2 (two) times daily.   fluticasone 50 MCG/ACT nasal spray Commonly known as: FLONASE Place 2 sprays into both nostrils daily.   hydrochlorothiazide 25 MG tablet Commonly known as: HYDRODIURIL Take 25 mg by mouth daily.   oxyCODONE-acetaminophen 5-325 MG tablet Commonly known as: PERCOCET/ROXICET Take 1-2 tablets by mouth every 6 (six) hours as needed for moderate pain.   potassium chloride SA 20 MEQ tablet Commonly known as: KLOR-CON M Take 20 mEq by mouth daily.   traZODone 100 MG tablet Commonly known as: DESYREL Take 50 mg by mouth at bedtime as needed.        Allergies:  Allergies  Allergen Reactions   Azithromycin Diarrhea and Hives   Latex Hives  Social History:  reports that she has never smoked. She has never used smokeless tobacco. She reports that she does not drink alcohol and does not use drugs.   Physical Exam: BP (!) 147/87   Pulse 98   Ht 5' (1.524 m)   Wt 191 lb (86.6 kg)   LMP 07/09/2023 (Approximate)   BMI 37.30 kg/m   Constitutional:  Alert and oriented, No acute distress. HEENT: [place] AT, moist mucus membranes.  Trachea midline, no masses. Neurologic: Grossly intact, no focal deficits, moving all 4 extremities. Psychiatric: Normal mood and affect.  Ultrasound renal complete  Narrative CLINICAL DATA:  Nephrolithiasis  Elevated creatinine  Prior right  nephrectomy  EXAM: RENAL / URINARY TRACT ULTRASOUND COMPLETE  COMPARISON:  CT abdomen pelvis 07/07/2023  FINDINGS: Right Kidney:  Status post right nephrectomy. No abnormality of the right nephrectomy bed.  Left Kidney:  Renal measurements: 13.9 x 6.1 x 5.8 cm = volume: 260 mL. Cortical thickness and echogenicity is normal. Mild hydronephrosis is new since prior CT. No suspicious lesions. 7 mm nonobstructing calculus seen in the lower pole.  Bladder:  Normal in appearance on prevoid images. Postvoid residual of 1461 mL.  Other:  None.  IMPRESSION: 1. Interval development of mild left hydronephrosis. 2. 7 mm nonobstructing calculus in the lower pole of the left kidney. 3. Significant postvoid bladder residual of 1461 mL.   Electronically Signed By: Acquanetta Belling M.D. On: 08/02/2023 12:13  This was personally reviewed and I agree with the radiologic interpretation.   Assessment & Plan:    1. Acute renal failure - Secondary to urinary retention - Foley catheter now placed - Recheck of labs on Friday - She is at risk for post-obstructive diuresis - Encourage fluid replacement and avoidance of NSAIDS  2. Urinary retention - Postoperative - Minimally symptomatic - Leave the Foley catheter for urinary decompression - Rest for a week - Follow up next week for voiding trial - Provided her with a smaller leg bag for convenience and a larger bag for home use. - Discussed catheter care and signs of complications.  Return in about 1 week (around 08/09/2023) for voiding trial.  I have reviewed the above documentation for accuracy and completeness, and I agree with the above.   Vanna Scotland, MD  [facility] [address] [place], [place] [address] [phone]

## 2023-08-02 NOTE — Progress Notes (Unsigned)
Simple Catheter Placement  Due to urinary retention patient is present today for a foley cath placement.  Patient was cleaned and prepped in a sterile fashion with betadine and 2% lidocaine jelly was instilled into the urethra. A 16 FR foley catheter was inserted, urine return was noted  2000 ml, urine was clear , yellow  in color.  The balloon was filled with 10cc of sterile water.  A leg bag was attached for drainage. Patient was also given a night bag to take home and was given instruction on how to change from one bag to another.  Patient was given instruction on proper catheter care.  Patient tolerated well, no complications were noted   Performed by: Vela Prose  Additional notes/ Follow up: 1 week voiding trial

## 2023-08-04 ENCOUNTER — Other Ambulatory Visit
Admission: RE | Admit: 2023-08-04 | Discharge: 2023-08-04 | Disposition: A | Discharge status: Home / Self Care | Payer: BC Managed Care – PPO | Source: Ambulatory Visit | Attending: Urology | Admitting: Urology

## 2023-08-04 DIAGNOSIS — R339 Retention of urine, unspecified: Secondary | ICD-10-CM | POA: Diagnosis not present

## 2023-08-04 DIAGNOSIS — R7989 Other specified abnormal findings of blood chemistry: Secondary | ICD-10-CM | POA: Insufficient documentation

## 2023-08-04 LAB — BASIC METABOLIC PANEL
Anion gap: 10 (ref 5–15)
BUN: 18 mg/dL (ref 6–20)
CO2: 29 mmol/L (ref 22–32)
Calcium: 9 mg/dL (ref 8.9–10.3)
Chloride: 104 mmol/L (ref 98–111)
Creatinine, Ser: 1.19 mg/dL — ABNORMAL HIGH (ref 0.44–1.00)
GFR, Estimated: 58 mL/min — ABNORMAL LOW (ref >60)
Glucose, Bld: 117 mg/dL — ABNORMAL HIGH (ref 70–99)
Potassium: 3.4 mmol/L — ABNORMAL LOW (ref 3.5–5.1)
Sodium: 143 mmol/L (ref 135–145)

## 2023-08-07 NOTE — Group Note (Deleted)

## 2023-08-09 ENCOUNTER — Ambulatory Visit: Payer: BC Managed Care – PPO | Admitting: Physician Assistant

## 2023-08-09 ENCOUNTER — Ambulatory Visit (INDEPENDENT_AMBULATORY_CARE_PROVIDER_SITE_OTHER): Payer: BC Managed Care – PPO | Admitting: Physician Assistant

## 2023-08-09 VITALS — BP 125/81 | HR 98 | Wt 169.0 lb

## 2023-08-09 DIAGNOSIS — R339 Retention of urine, unspecified: Secondary | ICD-10-CM | POA: Diagnosis not present

## 2023-08-09 LAB — BLADDER SCAN AMB NON-IMAGING

## 2023-08-09 NOTE — Progress Notes (Signed)
Catheter Removal  Patient is present today for a catheter removal.  8 ml of water was drained from the balloon. A 16 FR foley cath was removed from the bladder, no complications were noted. Patient tolerated well.  Performed by: Malcolm Metro RMA  Follow up/ Additional notes: f/up as scheduled

## 2023-08-09 NOTE — Progress Notes (Signed)
08/09/2023 3:17 PM   [name] [birth year] [id number]  CC: Chief Complaint  Patient presents with   voiding trial   HPI: Melanie Blankenship is a 42 y.o. female who developed urinary retention after undergoing hand-assisted laparoscopic right radical nephrectomy with Dr. Apolinar Junes on 07/24/2023 who presents today for voiding trial.  Foley catheter removed in the morning, see separate procedure note for details.  She returned to clinic in the afternoon and reports urinary frequency without discomfort or abdominal distention. PVR >792mL.  She denies saddle anesthesia, urinary incontinence, or fecal incontinence.  She reports some urinary retention after she gave birth to her son.  PMH: Past Medical History:  Diagnosis Date   History of kidney stones    Hypertension    Kidney stones    Migraine    Nephrolithiasis    Pneumonia    Pyelonephritis of right kidney     Surgical History: Past Surgical History:  Procedure Laterality Date   CHOLECYSTECTOMY  2018   EXTRACORPOREAL SHOCK WAVE LITHOTRIPSY Left 05/18/2023   Procedure: EXTRACORPOREAL SHOCK WAVE LITHOTRIPSY (ESWL);  Surgeon: Riki Altes, MD;  Location: [facility];  Service: Urology;  Laterality: Left;   LAPAROSCOPIC NEPHRECTOMY, HAND ASSISTED Right 07/24/2023   Procedure: HAND ASSISTED LAPAROSCOPIC RADICAL NEPHRECTOMY;  Surgeon: Vanna Scotland, MD;  Location: [facility];  Service: Urology;  Laterality: Right;   TONSILLECTOMY  2004    Home Medications:  Allergies as of 08/09/2023       Reactions   Azithromycin Diarrhea, Hives   Latex Hives        Medication List        Accurate as of August 09, 2023  3:17 PM. If you have any questions, ask your nurse or doctor.          acetaminophen 500 MG tablet Commonly known as: TYLENOL Take 500 mg by mouth every 6 (six) hours as needed.   diphenhydrAMINE 25 mg capsule Commonly known as: BENADRYL Take 25 mg by mouth every 6 (six) hours as needed.   docusate  sodium 100 MG capsule Commonly known as: COLACE Take 1 capsule (100 mg total) by mouth 2 (two) times daily.   fluticasone 50 MCG/ACT nasal spray Commonly known as: FLONASE Place 2 sprays into both nostrils daily.   hydrochlorothiazide 25 MG tablet Commonly known as: HYDRODIURIL Take 25 mg by mouth daily.   oxyCODONE-acetaminophen 5-325 MG tablet Commonly known as: PERCOCET/ROXICET Take 1-2 tablets by mouth every 6 (six) hours as needed for moderate pain.   potassium chloride SA 20 MEQ tablet Commonly known as: KLOR-CON M Take 20 mEq by mouth daily.   traZODone 100 MG tablet Commonly known as: DESYREL Take 50 mg by mouth at bedtime as needed.        Allergies:  Allergies  Allergen Reactions   Azithromycin Diarrhea and Hives   Latex Hives    Family History: No family history on file.  Social History:   reports that she has never smoked. She has never used smokeless tobacco. She reports that she does not drink alcohol and does not use drugs.  Physical Exam: BP 125/81   Pulse 98   Wt 169 lb (76.7 kg)   LMP 07/09/2023 (Approximate)   BMI 33.01 kg/m   Constitutional:  Alert and oriented, no acute distress, nontoxic appearing HEENT: , AT Cardiovascular: No clubbing, cyanosis, or edema Respiratory: Normal respiratory effort, no increased work of breathing Skin: No rashes, bruises or suspicious lesions Neurologic: Grossly intact, no focal  deficits, moving all 4 extremities Psychiatric: Normal mood and affect  Laboratory Data: Results for orders placed or performed in visit on 08/09/23  BLADDER SCAN AMB NON-IMAGING  Result Value Ref Range   Scan Result >728ml    Simple Catheter Placement  Due to urinary retention patient is present today for a foley cath placement.  Patient was cleaned and prepped in a sterile fashion with betadine and 2% lidocaine jelly was instilled into the urethra. A 16 FR foley catheter was inserted, urine return was noted  , urine  was yellow in color.  The balloon was filled with 10cc of sterile water.  A leg bag was attached for drainage.   Patient tolerated well, no complications were noted.  Performed by: Carman Ching, PA-C     Assessment & Plan:   1. Urinary retention Voiding trial failed.  I offered her Foley catheter replacement versus CIC teaching and she elected for the former.  She was rather tearful and frustrated today, which is understandable.  I was honest with her that I do not have a clear medical explanation for why she developed urinary retention, she is rather young for postop urinary retention and her surgery did not involve her bladder.  I recommended continued bladder rest with repeat voiding trial next week and she excepted.  Notably, she has a latex allergy but has been tolerating a latex catheter. - BLADDER SCAN AMB NON-IMAGING   Return in about 1 week (around 08/16/2023) for Voiding trial.  Carman Ching, PA-C  [facility] [address], [address] [place], [place] [address] [phone]

## 2023-08-16 ENCOUNTER — Ambulatory Visit: Payer: BC Managed Care – PPO | Admitting: Physician Assistant

## 2023-08-16 ENCOUNTER — Ambulatory Visit (INDEPENDENT_AMBULATORY_CARE_PROVIDER_SITE_OTHER): Payer: BC Managed Care – PPO | Admitting: Physician Assistant

## 2023-08-16 DIAGNOSIS — R339 Retention of urine, unspecified: Secondary | ICD-10-CM | POA: Diagnosis not present

## 2023-08-16 DIAGNOSIS — N9989 Other postprocedural complications and disorders of genitourinary system: Secondary | ICD-10-CM | POA: Diagnosis not present

## 2023-08-16 LAB — BLADDER SCAN AMB NON-IMAGING
PVR: 531 WU
PVR: 650 WU

## 2023-08-16 NOTE — Patient Instructions (Signed)
Step 1 Get all of your supplies ready and place near you. Step 2 Wash your hands or put on gloves. Step 3 Wash around urethral opening with warm antibacterial/ hypoallergenic soapy water from front to back. Step 4 take the catheter out of the package and drain the lubricant over toilet. Step 5 Sit on the toilet and spread your legs to begin catheterization. Step 6 Use your fingers to spread the labia and feel for the urethra.             A MIRROR MAY BE HELPFUL AT FIRST Step 7 Insert the catheter slowly into the urethra. If there is resistance when the catheter reaches the the sphincter muscle,              take a deep breath and gently apply steady pressure.            DO NOT FORCE THE CATHETER Step 8 When the urine begins to flow insert another inch, allow the urine to flow into the toilet. Step 9 When the flow of urine stops, slowly remove the catheter.

## 2023-08-16 NOTE — Progress Notes (Signed)
08/16/2023 10:38 AM   Melanie Blankenship 04/28/1981 [id number]  CC: Chief Complaint  Patient presents with   voiding trial   HPI: Melanie Blankenship is a 42 y.o. female with a remote history of postpartum urinary retention who developed urinary retention after undergoing hand-assisted laparoscopic right radical nephrectomy with Dr. Apolinar Junes on 07/24/2023 who presents today for repeat voiding trial after failing a voiding trial with me 1 week ago.  Foley catheter removed in the morning, see separate procedure note for details.  She returned to clinic in the afternoon and reports she has voided multiple times.  PVR ; she subsequently voided with repeat PVR 531 mL.  PMH: Past Medical History:  Diagnosis Date   History of kidney stones    Hypertension    Kidney stones    Migraine    Nephrolithiasis    Pneumonia    Pyelonephritis of right kidney     Surgical History: Past Surgical History:  Procedure Laterality Date   CHOLECYSTECTOMY  2018   EXTRACORPOREAL SHOCK WAVE LITHOTRIPSY Left 05/18/2023   Procedure: EXTRACORPOREAL SHOCK WAVE LITHOTRIPSY (ESWL);  Surgeon: Riki Altes, MD;  Location: [facility] ORS;  Service: Urology;  Laterality: Left;   LAPAROSCOPIC NEPHRECTOMY, HAND ASSISTED Right 07/24/2023   Procedure: HAND ASSISTED LAPAROSCOPIC RADICAL NEPHRECTOMY;  Surgeon: Vanna Scotland, MD;  Location: [facility] ORS;  Service: Urology;  Laterality: Right;   TONSILLECTOMY  2004    Home Medications:  Allergies as of 08/16/2023       Reactions   Azithromycin Diarrhea, Hives   Latex Hives        Medication List        Accurate as of August 16, 2023 10:38 AM. If you have any questions, ask your nurse or doctor.          acetaminophen 500 MG tablet Commonly known as: TYLENOL Take 500 mg by mouth every 6 (six) hours as needed.   diphenhydrAMINE 25 mg capsule Commonly known as: BENADRYL Take 25 mg by mouth every 6 (six) hours as needed.   docusate sodium 100 MG  capsule Commonly known as: COLACE Take 1 capsule (100 mg total) by mouth 2 (two) times daily.   fluticasone 50 MCG/ACT nasal spray Commonly known as: FLONASE Place 2 sprays into both nostrils daily.   hydrochlorothiazide 25 MG tablet Commonly known as: HYDRODIURIL Take 25 mg by mouth daily.   oxyCODONE-acetaminophen 5-325 MG tablet Commonly known as: PERCOCET/ROXICET Take 1-2 tablets by mouth every 6 (six) hours as needed for moderate pain.   potassium chloride SA 20 MEQ tablet Commonly known as: KLOR-CON M Take 20 mEq by mouth daily.   traZODone 100 MG tablet Commonly known as: DESYREL Take 50 mg by mouth at bedtime as needed.        Allergies:  Allergies  Allergen Reactions   Azithromycin Diarrhea and Hives   Latex Hives    Family History: No family history on file.  Social History:   reports that she has never smoked. She has never used smokeless tobacco. She reports that she does not drink alcohol and does not use drugs.  Physical Exam: There were no vitals taken for this visit.  Constitutional:  Alert and oriented, no acute distress, nontoxic appearing HEENT: [place], AT Cardiovascular: No clubbing, cyanosis, or edema Respiratory: Normal respiratory effort, no increased work of breathing Skin: No rashes, bruises or suspicious lesions Neurologic: Grossly intact, no focal deficits, moving all 4 extremities Psychiatric: Normal mood and affect  Laboratory Data: Results  for orders placed or performed in visit on 08/16/23  BLADDER SCAN AMB NON-IMAGING  Result Value Ref Range   PVR 650.0 WU   PVR 531.0 WU   Continuous Intermittent Catheterization  Due to urinary retention patient is present today for a teaching of self I & O Catheterization. Patient was given detailed verbal and printed instructions of self catheterization. Patient was cleaned and prepped in a clean fashion.  With instruction and assistance patient inserted a 14FR [employer] SpeediCath Standard  female catheter and urine return was noted 500 ml, urine was yellow in color. Patient tolerated well, no complications were noted Patient was given a sample bag with supplies to take home.  Instructions were given for patient to cath 2 times daily and on demand.  An order was placed with [employer] for catheters to be sent to the patient's home.  Performed by: Carman Ching, PA-C   Assessment & Plan:   1. Urinary retention Voiding trial failed today, though her residual is half of what it was last week.  I am optimistic that her bladder is slowly regaining function.  We discussed options including Foley catheter replacement versus [facility] teaching today and I strongly urged her to consider [facility] teaching.  She agreed, see note above.  She was able to identify her urethra on the first attempt and she did very well with this.  I gave her samples to take home.  We discussed cathing first thing in the morning and last thing before bed as well as on-demand to supplement spontaneous voids.  We also discussed leaning forward and using the Crede maneuver to increase bladder emptying.  She expressed understanding. - BLADDER SCAN AMB NON-IMAGING   I spent 35 minutes on the day of the encounter to include pre-visit record review, face-to-face time with the patient, and post-visit ordering of tests.   Return in about 1 week (around 08/23/2023) for Postop f/u with Dr. Apolinar Junes.  Carman Ching, PA-C  [facility] [place] [address], [address] [place], [place] [address] [phone]

## 2023-08-16 NOTE — Progress Notes (Signed)
Catheter Removal  Patient is present today for a catheter removal.  9 ml of water was drained from the balloon. A 16 FR foley cath was removed from the bladder, no complications were noted. Patient tolerated well.  Performed by: Aleiya Rye H RMA  Follow up/ Additional notes: f/u in the afternoon

## 2023-08-18 ENCOUNTER — Ambulatory Visit: Payer: BC Managed Care – PPO | Admitting: Physician Assistant

## 2023-08-20 DIAGNOSIS — R339 Retention of urine, unspecified: Secondary | ICD-10-CM | POA: Diagnosis not present

## 2023-08-23 ENCOUNTER — Encounter: Payer: Self-pay | Admitting: Urology

## 2023-08-23 ENCOUNTER — Ambulatory Visit (INDEPENDENT_AMBULATORY_CARE_PROVIDER_SITE_OTHER): Payer: BC Managed Care – PPO | Admitting: Urology

## 2023-08-23 VITALS — BP 143/93 | HR 106 | Ht 60.0 in | Wt 169.0 lb

## 2023-08-23 DIAGNOSIS — Z87448 Personal history of other diseases of urinary system: Secondary | ICD-10-CM

## 2023-08-23 DIAGNOSIS — R339 Retention of urine, unspecified: Secondary | ICD-10-CM

## 2023-08-23 DIAGNOSIS — Z87898 Personal history of other specified conditions: Secondary | ICD-10-CM

## 2023-08-23 DIAGNOSIS — R7989 Other specified abnormal findings of blood chemistry: Secondary | ICD-10-CM

## 2023-08-23 DIAGNOSIS — Z09 Encounter for follow-up examination after completed treatment for conditions other than malignant neoplasm: Secondary | ICD-10-CM

## 2023-08-23 NOTE — Progress Notes (Signed)
Melanie Blankenship,acting as a scribe for Melanie Scotland, MD.,have documented all relevant documentation on the behalf of Melanie Scotland, MD,as directed by  Melanie Scotland, MD while in the presence of Melanie Scotland, MD.  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  Melanie Blankenship returns today for routine post op.  Her post-operative course was complicated by massive urinary retention with acute kidney injury, which resolved with bladder decompression. Retake of creatinine was 1.19. She  failed several voiding trials and is now managed with [facility]. Etiology of her urinary retention is somewhat unclear.   She was having some issues with constipation.    Today, she reports that she is cathing twice a day. She does note an improvement in her retention and is able to spontaneously void with minimal residual when self-cathing afterwards. She recalls a similar incident of retention after a vaginal birth with an extended period of time before resolution. She is experiencing some tenderness at the incision sites but is gradually returning to regular activities.   PMH: Past Medical History:  Diagnosis Date   History of kidney stones    Hypertension    Kidney stones    Migraine    Nephrolithiasis    Pneumonia    Pyelonephritis of right kidney     Surgical History: Past Surgical History:  Procedure Laterality Date   CHOLECYSTECTOMY  2018   EXTRACORPOREAL SHOCK WAVE LITHOTRIPSY Left 05/18/2023   Procedure: EXTRACORPOREAL SHOCK WAVE LITHOTRIPSY (ESWL);  Surgeon: Riki Altes, MD;  Location: [facility];  Service: Urology;  Laterality: Left;   LAPAROSCOPIC NEPHRECTOMY, HAND ASSISTED Right 07/24/2023   Procedure:  HAND ASSISTED LAPAROSCOPIC RADICAL NEPHRECTOMY;  Surgeon: Melanie Scotland, MD;  Location: [facility];  Service: Urology;  Laterality: Right;   TONSILLECTOMY  2004    Home Medications:  Allergies as of 08/23/2023       Reactions   Azithromycin Diarrhea, Hives   Latex Hives        Medication List        Accurate as of August 23, 2023 11:03 AM. If you have any questions, ask your nurse or doctor.          acetaminophen 500 MG tablet Commonly known as: TYLENOL Take 500 mg by mouth every 6 (six) hours as needed.   diphenhydrAMINE 25 mg capsule Commonly known as: BENADRYL Take 25 mg by mouth every 6 (six) hours as needed.   docusate sodium 100 MG capsule Commonly known as: COLACE Take 1 capsule (100 mg total) by mouth 2 (two) times daily.   fluticasone 50 MCG/ACT nasal spray Commonly known as: FLONASE Place 2 sprays into both nostrils daily.   hydrochlorothiazide 25 MG tablet Commonly known as: HYDRODIURIL Take 25 mg by mouth daily.   oxyCODONE-acetaminophen 5-325 MG tablet Commonly known as: PERCOCET/ROXICET Take 1-2 tablets by mouth every 6 (six) hours as needed for moderate pain.   potassium chloride SA 20 MEQ tablet Commonly known as: KLOR-CON M Take 20 mEq by mouth daily.   traZODone 100 MG tablet Commonly known as: DESYREL Take 50 mg by mouth at bedtime as needed.        Allergies:  Allergies  Allergen Reactions   Azithromycin Diarrhea and  Hives   Latex Hives     Social History:  reports that she has never smoked. She has never used smokeless tobacco. She reports that she does not drink alcohol and does not use drugs.   Physical Exam: BP (!) 143/93   Pulse (!) 106   Ht 5' (1.524 m)   Wt 169 lb (76.7 kg)   BMI 33.01 kg/m   Constitutional:  Alert and oriented, No acute distress. HEENT: [place] AT, moist mucus membranes.  Trachea midline, no masses. Abdomen: Incision sites are clean, dry, and intact Neurologic: Grossly intact, no focal  deficits, moving all 4 extremities. Psychiatric: Normal mood and affect.   Assessment & Plan:    1. Incomplete bladder emptying/ Urinary retention - She seems to be aoiding spontaneously at this point in time. We'll have her start checking some residuals.  - We will titrate the frequency of self-cath.  2. Chronic right pyelonephritis - Status post right nephrectomy - Cleared to return to work with a note provided for potential self-catheterization needs. - She is gradually returning to regular activities and is advised to use common sense regarding physical exertion  3. History of acute kidney injury -recheck Cr in 2 weeks -Solitary kidney precautions  Return in about 4 weeks (around 09/20/2023) for repeat labs to monitor kidney function.  I have reviewed the above documentation for accuracy and completeness, and I agree with the above.   Melanie Scotland, MD   [facility] [address], [address] [place], [place] [address] [phone]

## 2023-08-25 ENCOUNTER — Encounter: Payer: Self-pay | Admitting: Urology

## 2023-08-30 DIAGNOSIS — Z13220 Encounter for screening for lipoid disorders: Secondary | ICD-10-CM | POA: Diagnosis not present

## 2023-08-30 DIAGNOSIS — Z23 Encounter for immunization: Secondary | ICD-10-CM | POA: Diagnosis not present

## 2023-08-30 DIAGNOSIS — Z124 Encounter for screening for malignant neoplasm of cervix: Secondary | ICD-10-CM | POA: Diagnosis not present

## 2023-08-30 DIAGNOSIS — N200 Calculus of kidney: Secondary | ICD-10-CM | POA: Diagnosis not present

## 2023-08-30 DIAGNOSIS — G4700 Insomnia, unspecified: Secondary | ICD-10-CM | POA: Diagnosis not present

## 2023-08-30 DIAGNOSIS — G43809 Other migraine, not intractable, without status migrainosus: Secondary | ICD-10-CM | POA: Diagnosis not present

## 2023-08-30 DIAGNOSIS — Z131 Encounter for screening for diabetes mellitus: Secondary | ICD-10-CM | POA: Diagnosis not present

## 2023-08-30 DIAGNOSIS — I159 Secondary hypertension, unspecified: Secondary | ICD-10-CM | POA: Diagnosis not present

## 2023-08-30 DIAGNOSIS — Z1151 Encounter for screening for human papillomavirus (HPV): Secondary | ICD-10-CM | POA: Diagnosis not present

## 2023-08-30 DIAGNOSIS — Z0000 Encounter for general adult medical examination without abnormal findings: Secondary | ICD-10-CM | POA: Diagnosis not present

## 2023-09-06 ENCOUNTER — Encounter: Payer: Self-pay | Admitting: Urology

## 2023-09-06 ENCOUNTER — Other Ambulatory Visit: Payer: BC Managed Care – PPO

## 2023-09-06 DIAGNOSIS — R339 Retention of urine, unspecified: Secondary | ICD-10-CM | POA: Diagnosis not present

## 2023-09-06 DIAGNOSIS — R7989 Other specified abnormal findings of blood chemistry: Secondary | ICD-10-CM

## 2023-09-07 LAB — BASIC METABOLIC PANEL
BUN/Creatinine Ratio: 11 (ref 9–23)
BUN: 12 mg/dL (ref 6–24)
CO2: 22 mmol/L (ref 20–29)
Calcium: 9.9 mg/dL (ref 8.7–10.2)
Chloride: 99 mmol/L (ref 96–106)
Creatinine, Ser: 1.05 mg/dL — ABNORMAL HIGH (ref 0.57–1.00)
Glucose: 100 mg/dL — ABNORMAL HIGH (ref 70–99)
Potassium: 3.6 mmol/L (ref 3.5–5.2)
Sodium: 141 mmol/L (ref 134–144)
eGFR: 68 mL/min/{1.73_m2} (ref >59)

## 2023-09-13 DIAGNOSIS — Z1231 Encounter for screening mammogram for malignant neoplasm of breast: Secondary | ICD-10-CM | POA: Diagnosis not present

## 2023-10-24 ENCOUNTER — Other Ambulatory Visit: Payer: Self-pay | Admitting: Urology

## 2024-01-06 ENCOUNTER — Emergency Department: Payer: Self-pay

## 2024-01-06 ENCOUNTER — Emergency Department
Admission: EM | Admit: 2024-01-06 | Discharge: 2024-01-06 | Disposition: A | Discharge status: Home / Self Care | Payer: Self-pay | Attending: Emergency Medicine | Admitting: Emergency Medicine

## 2024-01-06 ENCOUNTER — Other Ambulatory Visit: Payer: Self-pay

## 2024-01-06 ENCOUNTER — Encounter: Payer: Self-pay | Admitting: Intensive Care

## 2024-01-06 DIAGNOSIS — I10 Essential (primary) hypertension: Secondary | ICD-10-CM | POA: Insufficient documentation

## 2024-01-06 DIAGNOSIS — R109 Unspecified abdominal pain: Secondary | ICD-10-CM

## 2024-01-06 DIAGNOSIS — N200 Calculus of kidney: Secondary | ICD-10-CM | POA: Insufficient documentation

## 2024-01-06 LAB — COMPREHENSIVE METABOLIC PANEL
ALT: 48 U/L — ABNORMAL HIGH (ref 0–44)
AST: 34 U/L (ref 15–41)
Albumin: 4.2 g/dL (ref 3.5–5.0)
Alkaline Phosphatase: 58 U/L (ref 38–126)
Anion gap: 9 (ref 5–15)
BUN: 18 mg/dL (ref 6–20)
CO2: 26 mmol/L (ref 22–32)
Calcium: 9.2 mg/dL (ref 8.9–10.3)
Chloride: 102 mmol/L (ref 98–111)
Creatinine, Ser: 1.03 mg/dL — ABNORMAL HIGH (ref 0.44–1.00)
GFR, Estimated: >60 mL/min (ref >60)
Glucose, Bld: 84 mg/dL (ref 70–99)
Potassium: 4 mmol/L (ref 3.5–5.1)
Sodium: 137 mmol/L (ref 135–145)
Total Bilirubin: 0.5 mg/dL (ref 0.0–1.2)
Total Protein: 8 g/dL (ref 6.5–8.1)

## 2024-01-06 LAB — CBC WITH DIFFERENTIAL/PLATELET
Abs Immature Granulocytes: 0.05 10*3/uL (ref 0.00–0.07)
Basophils Absolute: 0.1 10*3/uL (ref 0.0–0.1)
Basophils Relative: 1 %
Eosinophils Absolute: 0.2 10*3/uL (ref 0.0–0.5)
Eosinophils Relative: 1 %
HCT: 43.9 % (ref 36.0–46.0)
Hemoglobin: 14.4 g/dL (ref 12.0–15.0)
Immature Granulocytes: 0 %
Lymphocytes Relative: 27 %
Lymphs Abs: 3.3 10*3/uL (ref 0.7–4.0)
MCH: 29.3 pg (ref 26.0–34.0)
MCHC: 32.8 g/dL (ref 30.0–36.0)
MCV: 89.4 fL (ref 80.0–100.0)
Monocytes Absolute: 1.1 10*3/uL — ABNORMAL HIGH (ref 0.1–1.0)
Monocytes Relative: 9 %
Neutro Abs: 7.4 10*3/uL (ref 1.7–7.7)
Neutrophils Relative %: 62 %
Platelets: 408 10*3/uL — ABNORMAL HIGH (ref 150–400)
RBC: 4.91 MIL/uL (ref 3.87–5.11)
RDW: 14.2 % (ref 11.5–15.5)
WBC: 12.1 10*3/uL — ABNORMAL HIGH (ref 4.0–10.5)
nRBC: 0 % (ref 0.0–0.2)

## 2024-01-06 LAB — URINALYSIS, ROUTINE W REFLEX MICROSCOPIC
Bacteria, UA: NONE SEEN
Bilirubin Urine: NEGATIVE
Glucose, UA: NEGATIVE mg/dL
Ketones, ur: NEGATIVE mg/dL
Leukocytes,Ua: NEGATIVE
Nitrite: NEGATIVE
Protein, ur: NEGATIVE mg/dL
Specific Gravity, Urine: 1.016 (ref 1.005–1.030)
pH: 5 (ref 5.0–8.0)

## 2024-01-06 MED ORDER — SODIUM CHLORIDE 0.9 % IV BOLUS
1000.0000 mL | Freq: Once | INTRAVENOUS | Status: AC
  Administered 2024-01-06: 1000 mL via INTRAVENOUS

## 2024-01-06 MED ORDER — ONDANSETRON HCL 4 MG/2ML IJ SOLN
4.0000 mg | Freq: Once | INTRAMUSCULAR | Status: AC
  Administered 2024-01-06: 4 mg via INTRAVENOUS
  Filled 2024-01-06: qty 2

## 2024-01-06 MED ORDER — HYDROMORPHONE HCL 1 MG/ML IJ SOLN
0.5000 mg | INTRAMUSCULAR | Status: AC
  Administered 2024-01-06: 0.5 mg via INTRAVENOUS
  Filled 2024-01-06: qty 0.5

## 2024-01-06 MED ORDER — OXYCODONE-ACETAMINOPHEN 5-325 MG PO TABS
1.0000 | ORAL_TABLET | Freq: Four times a day (QID) | ORAL | 0 refills | Status: AC | PRN
Start: 2024-01-06 — End: 2024-01-09

## 2024-01-06 MED ORDER — ONDANSETRON 4 MG PO TBDP: 4.0000 mg | ORAL_TABLET | Freq: Three times a day (TID) | ORAL | 0 refills | Status: AC | PRN

## 2024-01-06 NOTE — ED Triage Notes (Signed)
 Patient brought over from [facility] for CT scan. Reports left lower back pain. Denies radiation. Denies urinary symptoms    History right kidney removal 07/24/23

## 2024-01-06 NOTE — ED Provider Notes (Signed)
 [facility] Provider Note    Event Date/Time   First MD Initiated Contact with Patient 01/06/24 1109     (approximate)   History   Chief Complaint: Back Pain   HPI  Melanie Blankenship is a 43 y.o. female with history of kidney stones, hypertension, right nephrectomy who comes ED complaining of left flank pain radiating to left abdomen for the past week, waxing and waning, gradually worsening.  Denies fever or dysuria.  No hematuria.  No chest pain or shortness of breath.  Made appointment with her urology office for February 13.  Went to [place] clinic walk-in for pain control today and was sent to the ED for further evaluation.          Physical Exam   Triage Vital Signs: ED Triage Vitals  Encounter Vitals Group     BP 01/06/24 1049 135/89     Systolic BP Percentile --      Diastolic BP Percentile --      Pulse Rate 01/06/24 1049 93     Resp 01/06/24 1049 16     Temp 01/06/24 1049 98.6 F (37 C)     Temp Source 01/06/24 1049 Oral     SpO2 01/06/24 1049 98 %     Weight 01/06/24 1036 183 lb (83 kg)     Height 01/06/24 1036 5' 4 (1.626 m)     Head Circumference --      Peak Flow --      Pain Score 01/06/24 1036 5     Pain Loc --      Pain Education --      Exclude from Growth Chart --     Most recent vital signs: Vitals:   01/06/24 1049  BP: 135/89  Pulse: 93  Resp: 16  Temp: 98.6 F (37 C)  SpO2: 98%    General: Awake, no distress.  CV:  Good peripheral perfusion.  Regular rate and rhythm Resp:  Normal effort.  Abd:  No distention.  Soft with suprapubic tenderness Other:  No lower extremity edema, moist oral mucosa   ED Results / Procedures / Treatments   Labs (all labs ordered are listed, but only abnormal results are displayed) Labs Reviewed  CBC WITH DIFFERENTIAL/PLATELET - Abnormal; Notable for the following components:      Result Value   WBC 12.1 (*)    Platelets 408 (*)    Monocytes Absolute 1.1 (*)    All  other components within normal limits  COMPREHENSIVE METABOLIC PANEL - Abnormal; Notable for the following components:   Creatinine, Ser 1.03 (*)    ALT 48 (*)    All other components within normal limits  URINALYSIS, ROUTINE W REFLEX MICROSCOPIC - Abnormal; Notable for the following components:   Color, Urine YELLOW (*)    APPearance CLEAR (*)    Hgb urine dipstick SMALL (*)    All other components within normal limits  URINE CULTURE     EKG    RADIOLOGY CT abdomen pelvis interpreted by me, shows left nephrolithiasis without ureteral obstruction.  No signs of bowel obstruction or free air.  Radiology report reviewed   PROCEDURES:  Procedures   MEDICATIONS ORDERED IN ED: Medications  sodium chloride  0.9 % bolus 1,000 mL (1,000 mLs Intravenous New Bag/Given 01/06/24 1150)  ondansetron  (ZOFRAN ) injection 4 mg (4 mg Intravenous Given 01/06/24 1148)  HYDROmorphone  (DILAUDID ) injection 0.5 mg (0.5 mg Intravenous Given 01/06/24 1149)     IMPRESSION / MDM /  ASSESSMENT AND PLAN / ED COURSE  I reviewed the triage vital signs and the nursing notes.  DDx: Urolithiasis, diverticulitis, bowel obstruction, UTI, AKI  Patient's presentation is most consistent with acute presentation with potential threat to life or bodily function.  Patient presents with 1 week of left flank pain, has solitary kidney and history of kidney stones.  Vitals and labs are essentially normal.  CT abdomen pelvis negative for obstructing stone.  Will prescribe Percocet, Zofran , she will continue her outpatient follow-up as scheduled.       FINAL CLINICAL IMPRESSION(S) / ED DIAGNOSES   Final diagnoses:  Left flank pain  Left nephrolithiasis     Rx / DC Orders   ED Discharge Orders          Ordered    oxyCODONE -acetaminophen  (PERCOCET) 5-325 MG tablet  Every 6 hours PRN        01/06/24 1225    ondansetron  (ZOFRAN -ODT) 4 MG disintegrating tablet  Every 8 hours PRN        01/06/24 1225              Note:  This document was prepared using Dragon voice recognition software and may include unintentional dictation errors.   Viviann Pastor, MD 01/06/24 1236

## 2024-01-08 LAB — URINE CULTURE: Culture: 70000 — AB

## 2024-01-11 ENCOUNTER — Encounter: Payer: Self-pay | Admitting: Physician Assistant

## 2024-01-11 ENCOUNTER — Ambulatory Visit: Payer: BC Managed Care – PPO | Admitting: Physician Assistant

## 2024-01-11 VITALS — BP 142/103 | HR 102 | Ht 64.0 in | Wt 171.0 lb

## 2024-01-11 DIAGNOSIS — R109 Unspecified abdominal pain: Secondary | ICD-10-CM

## 2024-01-11 DIAGNOSIS — R339 Retention of urine, unspecified: Secondary | ICD-10-CM | POA: Diagnosis not present

## 2024-01-11 LAB — URINALYSIS, COMPLETE
Bilirubin, UA: NEGATIVE
Glucose, UA: NEGATIVE
Ketones, UA: NEGATIVE
Nitrite, UA: NEGATIVE
Protein,UA: NEGATIVE
Specific Gravity, UA: 1.02 (ref 1.005–1.030)
Urobilinogen, Ur: 0.2 mg/dL (ref 0.2–1.0)
pH, UA: 7 (ref 5.0–7.5)

## 2024-01-11 LAB — MICROSCOPIC EXAMINATION

## 2024-01-11 LAB — BLADDER SCAN AMB NON-IMAGING: PVR: 0 WU

## 2024-01-11 MED ORDER — NITROFURANTOIN MONOHYD MACRO 100 MG PO CAPS
100.0000 mg | ORAL_CAPSULE | Freq: Two times a day (BID) | ORAL | 0 refills | Status: AC
Start: 2024-01-11 — End: 2024-01-16

## 2024-01-11 NOTE — Progress Notes (Signed)
01/11/2024 10:32 AM   Melanie Blankenship 08-Nov-1981 [id number]  CC: Chief Complaint  Patient presents with   Nephrolithiasis   HPI: Melanie Blankenship is a 43 y.o. female with PMH chronic right pyelonephritis s/p right nephrectomy with postoperative urinary retention managed by [facility] who presents today for evaluation of left flank pain.   She was seen in the ED 5 days ago with reports of left flank pain.  CTAP without contrast showed nonobstructing left nephrolithiasis without hydronephrosis or perinephric stranding.  UA was bland, but urine culture finalized with low colony counts of pansensitive Staph epidermidis.  Today she reports persistent left flank pain without fever, nausea, or vomiting.  She has no longer self cathing.  Notably, on personal review of her CT from last week, the left renal stone appears to be a parenchymal calcification.  In-office UA today positive for trace intact blood and trace leukocytes; urine microscopy with 11-30 WBCs/HPF, 3-10 RBCs/HPF, and moderate bacteria. PVR 0mL.  PMH: Past Medical History:  Diagnosis Date   History of kidney stones    Hypertension    Kidney stones    Migraine    Nephrolithiasis    Pneumonia    Pyelonephritis of right kidney     Surgical History: Past Surgical History:  Procedure Laterality Date   CHOLECYSTECTOMY  2018   EXTRACORPOREAL SHOCK WAVE LITHOTRIPSY Left 05/18/2023   Procedure: EXTRACORPOREAL SHOCK WAVE LITHOTRIPSY (ESWL);  Surgeon: Melanie Altes, MD;  Location: [facility] ORS;  Service: Urology;  Laterality: Left;   LAPAROSCOPIC NEPHRECTOMY, HAND ASSISTED Right 07/24/2023   Procedure: HAND ASSISTED LAPAROSCOPIC RADICAL NEPHRECTOMY;  Surgeon: Melanie Scotland, MD;  Location: [facility];  Service: Urology;  Laterality: Right;   TONSILLECTOMY  2004    Home Medications:  Allergies as of 01/11/2024       Reactions   Azithromycin Diarrhea, Hives   Latex Hives        Medication List        Accurate as of  January 11, 2024 10:32 AM. If you have any questions, ask your nurse or doctor.          acetaminophen 500 MG tablet Commonly known as: TYLENOL Take 500 mg by mouth every 6 (six) hours as needed.   diphenhydrAMINE 25 mg capsule Commonly known as: BENADRYL Take 25 mg by mouth every 6 (six) hours as needed.   docusate sodium 100 MG capsule Commonly known as: COLACE Take 1 capsule (100 mg total) by mouth 2 (two) times daily.   fluticasone 50 MCG/ACT nasal spray Commonly known as: FLONASE Place 2 sprays into both nostrils daily.   hydrochlorothiazide 25 MG tablet Commonly known as: HYDRODIURIL Take 25 mg by mouth daily.   nitrofurantoin (macrocrystal-monohydrate) 100 MG capsule Commonly known as: MACROBID Take 1 capsule (100 mg total) by mouth 2 (two) times daily for 5 days. Started by: Melanie Blankenship   ondansetron 4 MG disintegrating tablet Commonly known as: ZOFRAN-ODT Take 1 tablet (4 mg total) by mouth every 8 (eight) hours as needed for nausea or vomiting.   potassium chloride SA 20 MEQ tablet Commonly known as: KLOR-CON M Take 20 mEq by mouth daily.   traZODone 100 MG tablet Commonly known as: DESYREL Take 50 mg by mouth at bedtime as needed.        Allergies:  Allergies  Allergen Reactions   Azithromycin Diarrhea and Hives   Latex Hives    Family History: No family history on file.  Social History:   reports that  she has never smoked. She has never used smokeless tobacco. She reports that she does not drink alcohol and does not use drugs.  Physical Exam: BP (!) 142/103   Pulse (!) 102   Ht 5\' 4"  (1.626 m)   Wt 171 lb (77.6 kg)   BMI 29.35 kg/m   Constitutional:  Alert and oriented, no acute distress, nontoxic appearing HEENT: [place], AT Cardiovascular: No clubbing, cyanosis, or edema Respiratory: Normal respiratory effort, no increased work of breathing Skin: No rashes, bruises or suspicious lesions Neurologic: Grossly intact, no focal  deficits, moving all 4 extremities Psychiatric: Normal mood and affect  Laboratory Data: Results for orders placed or performed in visit on 01/11/24  Microscopic Examination   Collection Time: 01/11/24  9:53 AM   Urine  Result Value Ref Range   WBC, UA 11-30 (A) 0 - 5 /hpf   RBC, Urine 3-10 (A) 0 - 2 /hpf   Epithelial Cells (non renal) 0-10 0 - 10 /hpf   Bacteria, UA Moderate (A) None seen/Few  Urinalysis, Complete   Collection Time: 01/11/24  9:53 AM  Result Value Ref Range   Specific Gravity, UA 1.020 1.005 - 1.030   pH, UA 7.0 5.0 - 7.5   Color, UA Yellow Yellow   Appearance Ur Clear Clear   Leukocytes,UA Trace (A) Negative   Protein,UA Negative Negative/Trace   Glucose, UA Negative Negative   Ketones, UA Negative Negative   RBC, UA Trace (A) Negative   Bilirubin, UA Negative Negative   Urobilinogen, Ur 0.2 0.2 - 1.0 mg/dL   Nitrite, UA Negative Negative   Microscopic Examination See below:   BLADDER SCAN AMB NON-IMAGING   Collection Time: 01/11/24 10:34 AM  Result Value Ref Range   PVR 0.0 WU    Pertinent Imaging: CT AP w/o contrast, 01/06/2024: CLINICAL DATA:  Abdominal/flank pain, stone suspected, left flank pain, right nephrectomy, left lower back pain, history of stones, cholecystectomy   EXAM: CT ABDOMEN AND PELVIS WITHOUT CONTRAST   TECHNIQUE: Multidetector CT imaging of the abdomen and pelvis was performed following the standard protocol without IV contrast.   RADIATION DOSE REDUCTION: This exam was performed according to the departmental dose-optimization program which includes automated exposure control, adjustment of the mA and/or kV according to patient size and/or use of iterative reconstruction technique.   COMPARISON:  CT 07/07/2023   FINDINGS: Lower chest: No acute abnormality.   Hepatobiliary: Unremarkable liver. Cholecystectomy. No biliary dilation.   Pancreas: Unremarkable.   Spleen: Unremarkable.   Adrenals/Urinary Tract: Normal  adrenal glands. Right nephrectomy. Unchanged nonobstructing left nephrolithiasis. No obstructing ureteral calculi or hydronephrosis. Bladder is unremarkable.   Stomach/Bowel: Normal caliber large and small bowel. Colonic diverticulosis without diverticulitis. Fat deposition in the right colon wall. Normal finding or due to sequela of chronic inflammation. No evidence of acute inflammation. The appendix is normal.Stomach is within normal limits.   Vascular/Lymphatic: No significant vascular findings are present. No enlarged abdominal or pelvic lymph nodes.   Reproductive: Unremarkable.   Other: No free intraperitoneal fluid or air.   Musculoskeletal: No acute fracture.   IMPRESSION: 1. No acute abnormality in the abdomen or pelvis. 2. Unchanged nonobstructing left nephrolithiasis.     Electronically Signed   By: Minerva Fester M.D.   On: 01/06/2024 12:08  I personally reviewed the images referenced above and note a left renal parenchymal calcification.  Assessment & Plan:   1. Left flank pain (Primary) On personal review of her CT, the nonobstructing left nephrolithiasis appears  to be a parenchymal calcification.  We discussed that this is not the source of her pain and I do not believe it will ever move or cause her problems.  Her UA is a bit suspicious today, will start empiric Macrobid and send for culture for further evaluation.  Notably, I do think her positive urine culture from the ED represents sample contamination. - Urinalysis, Complete - CULTURE, URINE COMPREHENSIVE - nitrofurantoin, macrocrystal-monohydrate, (MACROBID) 100 MG capsule; Take 1 capsule (100 mg total) by mouth 2 (two) times daily for 5 days.  Dispense: 10 capsule; Refill: 0  2. Incomplete bladder emptying She is emptying fully off [facility].  Postop incomplete bladder emptying has resolved. - BLADDER SCAN AMB NON-IMAGING   Return if symptoms worsen or fail to improve.  Melanie Ching, PA-C  [facility] [place] [address] [place], [place] [address] [phone]

## 2024-01-16 LAB — CULTURE, URINE COMPREHENSIVE

## 2024-02-28 DIAGNOSIS — R7303 Prediabetes: Secondary | ICD-10-CM | POA: Insufficient documentation

## 2024-07-23 ENCOUNTER — Other Ambulatory Visit: Payer: Self-pay

## 2024-07-23 DIAGNOSIS — R109 Unspecified abdominal pain: Secondary | ICD-10-CM

## 2024-07-23 NOTE — Progress Notes (Signed)
 Pt left message online that she is having left plank pain. Pt had right kidney removed last year by Dr. Penne. She just states off and on pain in the side that comes and goes. She says she was told there was still a stone in there. I was able to schedule her for tomorrow with Clotilda, PA. KUB and UA ordered. Pt understood going to get x-ray before appt.

## 2024-07-23 NOTE — Progress Notes (Unsigned)
 07/24/2024 9:11 AM   Alan LITTIE Hacker 07-02-1981 [id number]  Referring provider: Logan Nest, MD [address] [place],  [place] [address]  Urological history: 1.  Nephrolithiasis -CT Renal stone study (12/2023) non obstructing left nephrolithiasis  2.  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 woman who presents today for left flank pain.    Previous records reviewed.   Her pain started last week suddenly in the left flank area.   She describes the pain as intermittent and sharp.  The pain is interrupting her sleep and she is experiencing urinary urgency.  Yesterday, the pain became constant.  She describes a tight sensation in the left flank area.  Patient denies any modifying or aggravating factors.  Patient denies any recent UTI's, gross hematuria, dysuria or suprapubic pain.  Patient denies any fevers, chills, nausea or vomiting.    UA positive for > 30 WBC's, > 10 epithelial cells, mucus present and many bacteria.     STAT urine pregnancy test negative  STAT CT renal stone study 6 mm non obstructive left kidney lower pole calculus.     Lab Results  Component Value Date   WBC 12.1 (H) 01/06/2024   HGB 14.4 01/06/2024   HCT 43.9 01/06/2024   MCV 89.4 01/06/2024   PLT 408 (H) 01/06/2024    Lab Results  Component Value Date   CREATININE 1.03 (H) 01/06/2024   Hbg A1c (08/2023) 5.8  PMH: Past Medical History:  Diagnosis Date   History of kidney stones    Hypertension    Kidney stones    Migraine    Nephrolithiasis    Pneumonia    Pyelonephritis of right kidney     Surgical History: Past Surgical History:  Procedure Laterality Date   CHOLECYSTECTOMY  2018   EXTRACORPOREAL SHOCK WAVE LITHOTRIPSY Left 05/18/2023   Procedure: EXTRACORPOREAL SHOCK WAVE LITHOTRIPSY (ESWL);  Surgeon: Twylla Glendia BROCKS, MD;  Location: [facility]  ORS;  Service: Urology;  Laterality: Left;   LAPAROSCOPIC NEPHRECTOMY, HAND ASSISTED Right 07/24/2023   Procedure: HAND ASSISTED LAPAROSCOPIC RADICAL NEPHRECTOMY;  Surgeon: Penne Knee, MD;  Location: [facility] ORS;  Service: Urology;  Laterality: Right;   TONSILLECTOMY  2004    Home Medications:  Allergies as of 07/24/2024       Reactions   Azithromycin Diarrhea, Hives   Latex Hives        Medication List        Accurate as of July 24, 2024  9:11 AM. If you have any questions, ask your nurse or doctor.          acetaminophen  500 MG tablet Commonly known as: TYLENOL  Take 500 mg by mouth every 6 (six) hours as needed.   diphenhydrAMINE  25 mg capsule Commonly known as: BENADRYL  Take 25 mg by mouth every 6 (six) hours as needed.   docusate sodium  100 MG capsule Commonly known as: COLACE Take 1 capsule (100 mg total) by mouth 2 (two) times daily.   fluticasone  50 MCG/ACT nasal spray Commonly known as: FLONASE  Place 2 sprays into both nostrils daily.   hydrochlorothiazide  25 MG tablet Commonly known as: HYDRODIURIL  Take 25 mg by mouth daily.   magnesium gluconate 54mg /66ml syringe Take 10 mg/kg by mouth daily.   ondansetron  4 MG disintegrating tablet Commonly known as: ZOFRAN -ODT Take 1 tablet (4 mg total) by mouth every 8 (eight) hours  as needed for nausea or vomiting.   potassium chloride  SA 20 MEQ tablet Commonly known as: KLOR-CON  M Take 20 mEq by mouth daily.   propranolol 10 MG tablet Commonly known as: INDERAL Take 10 mg by mouth 2 (two) times daily.   traZODone  100 MG tablet Commonly known as: DESYREL  Take 50 mg by mouth at bedtime as needed.        Allergies:  Allergies  Allergen Reactions   Azithromycin Diarrhea and Hives   Latex Hives    Family History: No family history on file.  Social History: See HPI for pertinent social history  ROS: Pertinent ROS in HPI  Physical Exam: BP (!) 137/93 (BP Location: Left Arm, Patient  Position: Sitting, Cuff Size: Large)   Pulse 89   Ht 5' 4 (1.626 m)   Wt 190 lb (86.2 kg)   SpO2 100%   BMI 32.61 kg/m   Constitutional:  Well nourished. Alert and oriented, No acute distress. HEENT: Paint AT, moist mucus membranes.  Trachea midline Cardiovascular: No clubbing, cyanosis, or edema. Respiratory: Normal respiratory effort, no increased work of breathing. Neurologic: Grossly intact, no focal deficits, moving all 4 extremities. Psychiatric: Normal mood and affect.    Laboratory Data: See EPIC and HPI  I have reviewed the labs.   Pertinent Imaging: CLINICAL DATA:  Left flank pain   EXAM: CT ABDOMEN AND PELVIS WITHOUT CONTRAST   TECHNIQUE: Multidetector CT imaging of the abdomen and pelvis was performed following the standard protocol without IV contrast.   RADIATION DOSE REDUCTION: This exam was performed according to the departmental dose-optimization program which includes automated exposure control, adjustment of the mA and/or kV according to patient size and/or use of iterative reconstruction technique.   COMPARISON:  01/06/2024   FINDINGS: Lower chest: Unremarkable   Hepatobiliary: Cholecystectomy.  Otherwise unremarkable.   Pancreas: Unremarkable   Spleen: Unremarkable   Adrenals/Urinary Tract: Adrenal glands unremarkable. Right nephrectomy. 6 mm nonobstructive left kidney lower pole calculus.   Stomach/Bowel: Fatty deposition in the wall of the terminal ileum and proximal colon, typically a incidental/normal finding, although this can have a weak association with inflammatory bowel disease. No current findings of bowel inflammation.   Vascular/Lymphatic: Unremarkable   Reproductive: Unremarkable   Other: No supplemental non-categorized findings.   Musculoskeletal: Unremarkable   IMPRESSION: 1. 6 mm nonobstructive left kidney lower pole calculus. 2. Right nephrectomy. 3. Fatty deposition in the wall of the terminal ileum and  proximal colon, typically a incidental/normal finding, although this can have a weak association with inflammatory bowel disease. No current findings of bowel inflammation.     Electronically Signed   By: Ryan Salvage M.D.   On: 07/24/2024 09:58   I have independently reviewed the films.    Assessment & Plan:    1. Left flank pain - CT does not demonstrate an obstructing left ureteral stone or hydronephrosis.  It also does not demonstrate any other etiology for her left-sided flank pain. - Her urinalysis is being sent for culture and I have started her on Ceftin  500 mg twice daily for 7 days and we will adjust if necessary once urine culture results are available - She will reach out to me if her symptoms worsen  2. Solitary left kidney - no hydro seen on today's CT - left renal stone in the lower pole stone   No follow-ups on file.  These notes generated with voice recognition software. I apologize for typographical errors.  Soledad Budreau, PA-C  [facility] [address]  [address] [place], [place] [address] [phone]

## 2024-07-24 ENCOUNTER — Ambulatory Visit
Admission: RE | Admit: 2024-07-24 | Discharge: 2024-07-24 | Disposition: A | Discharge status: Home / Self Care | Source: Ambulatory Visit | Attending: Urology | Admitting: Urology

## 2024-07-24 ENCOUNTER — Encounter: Payer: Self-pay | Admitting: Urology

## 2024-07-24 ENCOUNTER — Ambulatory Visit (INDEPENDENT_AMBULATORY_CARE_PROVIDER_SITE_OTHER): Admitting: Urology

## 2024-07-24 ENCOUNTER — Other Ambulatory Visit
Admission: RE | Admit: 2024-07-24 | Discharge: 2024-07-24 | Disposition: A | Discharge status: Home / Self Care | Source: Ambulatory Visit | Attending: Urology | Admitting: Urology

## 2024-07-24 VITALS — BP 137/93 | HR 89 | Ht 64.0 in | Wt 190.0 lb

## 2024-07-24 DIAGNOSIS — R109 Unspecified abdominal pain: Secondary | ICD-10-CM

## 2024-07-24 DIAGNOSIS — N200 Calculus of kidney: Secondary | ICD-10-CM

## 2024-07-24 LAB — URINALYSIS, COMPLETE
Bilirubin, UA: NEGATIVE
Glucose, UA: NEGATIVE
Ketones, UA: NEGATIVE
Nitrite, UA: NEGATIVE
Protein,UA: NEGATIVE
Specific Gravity, UA: 1.02 (ref 1.005–1.030)
Urobilinogen, Ur: 0.2 mg/dL (ref 0.2–1.0)
pH, UA: 6 (ref 5.0–7.5)

## 2024-07-24 LAB — PREGNANCY, URINE: Preg Test, Ur: NEGATIVE

## 2024-07-24 LAB — MICROSCOPIC EXAMINATION
Epithelial Cells (non renal): >10 /HPF — AB (ref 0–10)
WBC, UA: >30 /HPF — AB (ref 0–5)

## 2024-07-24 MED ORDER — CEFUROXIME AXETIL 500 MG PO TABS: 500.0000 mg | ORAL_TABLET | Freq: Two times a day (BID) | ORAL | 0 refills | Status: AC

## 2024-07-31 LAB — CULTURE, URINE COMPREHENSIVE

## 2024-08-01 ENCOUNTER — Ambulatory Visit: Payer: Self-pay | Admitting: Urology

## 2024-08-01 ENCOUNTER — Other Ambulatory Visit: Payer: Self-pay

## 2024-08-01 MED ORDER — NITROFURANTOIN MONOHYD MACRO 100 MG PO CAPS
100.0000 mg | ORAL_CAPSULE | Freq: Two times a day (BID) | ORAL | 0 refills | Status: AC
Start: 2024-08-01 — End: ?
